# Patient Record
Sex: Female | Born: 1945 | Race: White | Hispanic: No | Marital: Married
Health system: Southern US, Community
[De-identification: ages and names within clinical notes are randomized; demographics above are authoritative.]

## PROBLEM LIST (undated history)

## (undated) DIAGNOSIS — I10 Essential (primary) hypertension: Secondary | ICD-10-CM

## (undated) DIAGNOSIS — J449 Chronic obstructive pulmonary disease, unspecified: Secondary | ICD-10-CM

## (undated) DIAGNOSIS — C3490 Malignant neoplasm of unspecified part of unspecified bronchus or lung: Secondary | ICD-10-CM

---

## 2018-04-28 ENCOUNTER — Encounter (HOSPITAL_COMMUNITY): Payer: Self-pay | Admitting: Adult Health

## 2018-04-28 ENCOUNTER — Inpatient Hospital Stay (HOSPITAL_COMMUNITY)
Admission: AD | Admit: 2018-04-28 | Discharge: 2018-06-02 | DRG: 853 | Disposition: E | Payer: Medicare Other | Source: Other Acute Inpatient Hospital | Attending: Pulmonary Disease | Admitting: Pulmonary Disease

## 2018-04-28 ENCOUNTER — Inpatient Hospital Stay (HOSPITAL_COMMUNITY): Payer: Medicare Other

## 2018-04-28 DIAGNOSIS — Z781 Physical restraint status: Secondary | ICD-10-CM

## 2018-04-28 DIAGNOSIS — D649 Anemia, unspecified: Secondary | ICD-10-CM | POA: Diagnosis not present

## 2018-04-28 DIAGNOSIS — I5021 Acute systolic (congestive) heart failure: Secondary | ICD-10-CM | POA: Diagnosis not present

## 2018-04-28 DIAGNOSIS — I313 Pericardial effusion (noninflammatory): Secondary | ICD-10-CM | POA: Diagnosis not present

## 2018-04-28 DIAGNOSIS — E876 Hypokalemia: Secondary | ICD-10-CM | POA: Diagnosis present

## 2018-04-28 DIAGNOSIS — R0603 Acute respiratory distress: Secondary | ICD-10-CM

## 2018-04-28 DIAGNOSIS — I252 Old myocardial infarction: Secondary | ICD-10-CM

## 2018-04-28 DIAGNOSIS — G9340 Encephalopathy, unspecified: Secondary | ICD-10-CM | POA: Diagnosis not present

## 2018-04-28 DIAGNOSIS — G9341 Metabolic encephalopathy: Secondary | ICD-10-CM | POA: Diagnosis present

## 2018-04-28 DIAGNOSIS — I110 Hypertensive heart disease with heart failure: Secondary | ICD-10-CM | POA: Diagnosis present

## 2018-04-28 DIAGNOSIS — Z9981 Dependence on supplemental oxygen: Secondary | ICD-10-CM

## 2018-04-28 DIAGNOSIS — I21A1 Myocardial infarction type 2: Secondary | ICD-10-CM | POA: Diagnosis present

## 2018-04-28 DIAGNOSIS — J189 Pneumonia, unspecified organism: Secondary | ICD-10-CM

## 2018-04-28 DIAGNOSIS — I2510 Atherosclerotic heart disease of native coronary artery without angina pectoris: Secondary | ICD-10-CM | POA: Diagnosis present

## 2018-04-28 DIAGNOSIS — R918 Other nonspecific abnormal finding of lung field: Secondary | ICD-10-CM | POA: Diagnosis not present

## 2018-04-28 DIAGNOSIS — J9621 Acute and chronic respiratory failure with hypoxia: Secondary | ICD-10-CM | POA: Diagnosis not present

## 2018-04-28 DIAGNOSIS — I248 Other forms of acute ischemic heart disease: Secondary | ICD-10-CM | POA: Diagnosis not present

## 2018-04-28 DIAGNOSIS — D6489 Other specified anemias: Secondary | ICD-10-CM | POA: Diagnosis present

## 2018-04-28 DIAGNOSIS — J151 Pneumonia due to Pseudomonas: Secondary | ICD-10-CM | POA: Diagnosis present

## 2018-04-28 DIAGNOSIS — G4733 Obstructive sleep apnea (adult) (pediatric): Secondary | ICD-10-CM | POA: Diagnosis present

## 2018-04-28 DIAGNOSIS — Z7189 Other specified counseling: Secondary | ICD-10-CM | POA: Diagnosis not present

## 2018-04-28 DIAGNOSIS — J441 Chronic obstructive pulmonary disease with (acute) exacerbation: Secondary | ICD-10-CM | POA: Diagnosis present

## 2018-04-28 DIAGNOSIS — Z9221 Personal history of antineoplastic chemotherapy: Secondary | ICD-10-CM | POA: Diagnosis not present

## 2018-04-28 DIAGNOSIS — I4891 Unspecified atrial fibrillation: Secondary | ICD-10-CM | POA: Diagnosis present

## 2018-04-28 DIAGNOSIS — T4275XA Adverse effect of unspecified antiepileptic and sedative-hypnotic drugs, initial encounter: Secondary | ICD-10-CM | POA: Diagnosis not present

## 2018-04-28 DIAGNOSIS — R739 Hyperglycemia, unspecified: Secondary | ICD-10-CM | POA: Diagnosis not present

## 2018-04-28 DIAGNOSIS — R0602 Shortness of breath: Secondary | ICD-10-CM | POA: Diagnosis not present

## 2018-04-28 DIAGNOSIS — Z66 Do not resuscitate: Secondary | ICD-10-CM | POA: Diagnosis present

## 2018-04-28 DIAGNOSIS — Z01818 Encounter for other preprocedural examination: Secondary | ICD-10-CM

## 2018-04-28 DIAGNOSIS — F329 Major depressive disorder, single episode, unspecified: Secondary | ICD-10-CM | POA: Diagnosis present

## 2018-04-28 DIAGNOSIS — J9600 Acute respiratory failure, unspecified whether with hypoxia or hypercapnia: Secondary | ICD-10-CM

## 2018-04-28 DIAGNOSIS — C3411 Malignant neoplasm of upper lobe, right bronchus or lung: Secondary | ICD-10-CM | POA: Diagnosis present

## 2018-04-28 DIAGNOSIS — Z8572 Personal history of non-Hodgkin lymphomas: Secondary | ICD-10-CM

## 2018-04-28 DIAGNOSIS — Z923 Personal history of irradiation: Secondary | ICD-10-CM

## 2018-04-28 DIAGNOSIS — R6521 Severe sepsis with septic shock: Secondary | ICD-10-CM | POA: Diagnosis present

## 2018-04-28 DIAGNOSIS — Z9911 Dependence on respirator [ventilator] status: Secondary | ICD-10-CM | POA: Diagnosis not present

## 2018-04-28 DIAGNOSIS — I952 Hypotension due to drugs: Secondary | ICD-10-CM | POA: Diagnosis present

## 2018-04-28 DIAGNOSIS — I214 Non-ST elevation (NSTEMI) myocardial infarction: Secondary | ICD-10-CM | POA: Diagnosis not present

## 2018-04-28 DIAGNOSIS — I10 Essential (primary) hypertension: Secondary | ICD-10-CM | POA: Diagnosis present

## 2018-04-28 DIAGNOSIS — J309 Allergic rhinitis, unspecified: Secondary | ICD-10-CM | POA: Diagnosis not present

## 2018-04-28 DIAGNOSIS — F17200 Nicotine dependence, unspecified, uncomplicated: Secondary | ICD-10-CM | POA: Diagnosis present

## 2018-04-28 DIAGNOSIS — J449 Chronic obstructive pulmonary disease, unspecified: Secondary | ICD-10-CM | POA: Diagnosis not present

## 2018-04-28 DIAGNOSIS — J9690 Respiratory failure, unspecified, unspecified whether with hypoxia or hypercapnia: Secondary | ICD-10-CM

## 2018-04-28 DIAGNOSIS — Z4659 Encounter for fitting and adjustment of other gastrointestinal appliance and device: Secondary | ICD-10-CM

## 2018-04-28 DIAGNOSIS — Z882 Allergy status to sulfonamides status: Secondary | ICD-10-CM

## 2018-04-28 DIAGNOSIS — J9622 Acute and chronic respiratory failure with hypercapnia: Secondary | ICD-10-CM | POA: Diagnosis not present

## 2018-04-28 DIAGNOSIS — Z85118 Personal history of other malignant neoplasm of bronchus and lung: Secondary | ICD-10-CM

## 2018-04-28 DIAGNOSIS — R451 Restlessness and agitation: Secondary | ICD-10-CM | POA: Diagnosis not present

## 2018-04-28 DIAGNOSIS — J810 Acute pulmonary edema: Secondary | ICD-10-CM | POA: Diagnosis not present

## 2018-04-28 DIAGNOSIS — Z515 Encounter for palliative care: Secondary | ICD-10-CM | POA: Diagnosis present

## 2018-04-28 DIAGNOSIS — Y95 Nosocomial condition: Secondary | ICD-10-CM | POA: Diagnosis present

## 2018-04-28 DIAGNOSIS — I5043 Acute on chronic combined systolic (congestive) and diastolic (congestive) heart failure: Secondary | ICD-10-CM | POA: Diagnosis present

## 2018-04-28 DIAGNOSIS — E872 Acidosis: Secondary | ICD-10-CM

## 2018-04-28 DIAGNOSIS — E1165 Type 2 diabetes mellitus with hyperglycemia: Secondary | ICD-10-CM | POA: Diagnosis present

## 2018-04-28 DIAGNOSIS — Z6828 Body mass index (BMI) 28.0-28.9, adult: Secondary | ICD-10-CM

## 2018-04-28 DIAGNOSIS — J9601 Acute respiratory failure with hypoxia: Secondary | ICD-10-CM | POA: Diagnosis present

## 2018-04-28 DIAGNOSIS — I340 Nonrheumatic mitral (valve) insufficiency: Secondary | ICD-10-CM | POA: Diagnosis not present

## 2018-04-28 DIAGNOSIS — E874 Mixed disorder of acid-base balance: Secondary | ICD-10-CM | POA: Diagnosis present

## 2018-04-28 DIAGNOSIS — T380X5A Adverse effect of glucocorticoids and synthetic analogues, initial encounter: Secondary | ICD-10-CM | POA: Diagnosis present

## 2018-04-28 DIAGNOSIS — E669 Obesity, unspecified: Secondary | ICD-10-CM | POA: Diagnosis present

## 2018-04-28 DIAGNOSIS — Z881 Allergy status to other antibiotic agents status: Secondary | ICD-10-CM | POA: Diagnosis not present

## 2018-04-28 DIAGNOSIS — J440 Chronic obstructive pulmonary disease with acute lower respiratory infection: Secondary | ICD-10-CM | POA: Diagnosis present

## 2018-04-28 DIAGNOSIS — A419 Sepsis, unspecified organism: Principal | ICD-10-CM | POA: Diagnosis present

## 2018-04-28 DIAGNOSIS — E785 Hyperlipidemia, unspecified: Secondary | ICD-10-CM | POA: Diagnosis present

## 2018-04-28 DIAGNOSIS — R1310 Dysphagia, unspecified: Secondary | ICD-10-CM | POA: Diagnosis not present

## 2018-04-28 DIAGNOSIS — F419 Anxiety disorder, unspecified: Secondary | ICD-10-CM | POA: Diagnosis present

## 2018-04-28 DIAGNOSIS — C3490 Malignant neoplasm of unspecified part of unspecified bronchus or lung: Secondary | ICD-10-CM | POA: Diagnosis not present

## 2018-04-28 DIAGNOSIS — Z91013 Allergy to seafood: Secondary | ICD-10-CM | POA: Diagnosis not present

## 2018-04-28 DIAGNOSIS — R34 Anuria and oliguria: Secondary | ICD-10-CM | POA: Diagnosis not present

## 2018-04-28 DIAGNOSIS — R061 Stridor: Secondary | ICD-10-CM | POA: Diagnosis not present

## 2018-04-28 DIAGNOSIS — Z0189 Encounter for other specified special examinations: Secondary | ICD-10-CM

## 2018-04-28 HISTORY — DX: Chronic obstructive pulmonary disease, unspecified: J44.9

## 2018-04-28 HISTORY — DX: Malignant neoplasm of unspecified part of unspecified bronchus or lung: C34.90

## 2018-04-28 HISTORY — DX: Essential (primary) hypertension: I10

## 2018-04-28 LAB — CBC WITH DIFFERENTIAL/PLATELET
Abs Immature Granulocytes: 0.1 10*3/uL (ref 0.0–0.1)
Basophils Absolute: 0.1 10*3/uL (ref 0.0–0.1)
Basophils Relative: 1 %
Eosinophils Absolute: 0.1 10*3/uL (ref 0.0–0.7)
Eosinophils Relative: 1 %
HCT: 39.2 % (ref 36.0–46.0)
Hemoglobin: 11.8 g/dL — ABNORMAL LOW (ref 12.0–15.0)
Immature Granulocytes: 0 %
Lymphocytes Relative: 25 %
Lymphs Abs: 3 10*3/uL (ref 0.7–4.0)
MCH: 28.2 pg (ref 26.0–34.0)
MCHC: 30.1 g/dL (ref 30.0–36.0)
MCV: 93.6 fL (ref 78.0–100.0)
Monocytes Absolute: 0.8 10*3/uL (ref 0.1–1.0)
Monocytes Relative: 7 %
Neutro Abs: 7.9 10*3/uL — ABNORMAL HIGH (ref 1.7–7.7)
Neutrophils Relative %: 66 %
Platelets: 330 10*3/uL (ref 150–400)
RBC: 4.19 MIL/uL (ref 3.87–5.11)
RDW: 13.6 % (ref 11.5–15.5)
WBC: 11.9 10*3/uL — ABNORMAL HIGH (ref 4.0–10.5)

## 2018-04-28 LAB — POCT I-STAT 3, ART BLOOD GAS (G3+)
ACID-BASE DEFICIT: 8 mmol/L — AB (ref 0.0–2.0)
Bicarbonate: 20.4 mmol/L (ref 20.0–28.0)
O2 Saturation: 100 %
TCO2: 22 mmol/L (ref 22–32)
pCO2 arterial: 52.4 mmHg — ABNORMAL HIGH (ref 32.0–48.0)
pH, Arterial: 7.199 — CL (ref 7.350–7.450)
pO2, Arterial: 237 mmHg — ABNORMAL HIGH (ref 83.0–108.0)

## 2018-04-28 LAB — COMPREHENSIVE METABOLIC PANEL
ALT: 84 U/L — ABNORMAL HIGH (ref 0–44)
AST: 88 U/L — AB (ref 15–41)
Albumin: 3.1 g/dL — ABNORMAL LOW (ref 3.5–5.0)
Alkaline Phosphatase: 54 U/L (ref 38–126)
Anion gap: 9 (ref 5–15)
BILIRUBIN TOTAL: 0.3 mg/dL (ref 0.3–1.2)
BUN: 11 mg/dL (ref 8–23)
CHLORIDE: 113 mmol/L — AB (ref 98–111)
CO2: 22 mmol/L (ref 22–32)
Calcium: 7.5 mg/dL — ABNORMAL LOW (ref 8.9–10.3)
Creatinine, Ser: 0.9 mg/dL (ref 0.44–1.00)
GFR calc Af Amer: >60 mL/min (ref >60)
GFR calc non Af Amer: >60 mL/min (ref >60)
GLUCOSE: 124 mg/dL — AB (ref 70–99)
POTASSIUM: 4.1 mmol/L (ref 3.5–5.1)
Sodium: 144 mmol/L (ref 135–145)
Total Protein: 5.6 g/dL — ABNORMAL LOW (ref 6.5–8.1)

## 2018-04-28 LAB — GLUCOSE, CAPILLARY
GLUCOSE-CAPILLARY: 125 mg/dL — AB (ref 70–99)
GLUCOSE-CAPILLARY: 179 mg/dL — AB (ref 70–99)

## 2018-04-28 LAB — MAGNESIUM: MAGNESIUM: 1.8 mg/dL (ref 1.7–2.4)

## 2018-04-28 LAB — PROTIME-INR
INR: 1.15
Prothrombin Time: 14.6 s (ref 11.4–15.2)

## 2018-04-28 LAB — TYPE AND SCREEN
ABO/RH(D): O POS
Antibody Screen: NEGATIVE

## 2018-04-28 LAB — ABO/RH: ABO/RH(D): O POS

## 2018-04-28 LAB — PHOSPHORUS: PHOSPHORUS: 3.6 mg/dL (ref 2.5–4.6)

## 2018-04-28 LAB — PROCALCITONIN: Procalcitonin: 1.72 ng/mL

## 2018-04-28 LAB — TRIGLYCERIDES: Triglycerides: 180 mg/dL — ABNORMAL HIGH

## 2018-04-28 LAB — LACTIC ACID, PLASMA
Lactic Acid, Venous: 1.3 mmol/L (ref 0.5–1.9)
Lactic Acid, Venous: 1.8 mmol/L (ref 0.5–1.9)

## 2018-04-28 LAB — MRSA PCR SCREENING: MRSA by PCR: NEGATIVE

## 2018-04-28 LAB — TROPONIN I
Troponin I: 0.99 ng/mL (ref <0.03)
Troponin I: 0.64 ng/mL

## 2018-04-28 MED ORDER — FENTANYL 2500MCG IN NS 250ML (10MCG/ML) PREMIX INFUSION
25.0000 ug/h | INTRAVENOUS | Status: DC
  Administered 2018-04-28: 150 ug/h via INTRAVENOUS
  Administered 2018-04-29: 175 ug/h via INTRAVENOUS
  Administered 2018-04-29 – 2018-04-30 (×2): 200 ug/h via INTRAVENOUS
  Administered 2018-05-01: 150 ug/h via INTRAVENOUS
  Administered 2018-05-02 (×2): 75 ug/h via INTRAVENOUS
  Filled 2018-04-28 (×5): qty 250

## 2018-04-28 MED ORDER — FENTANYL BOLUS VIA INFUSION
25.0000 ug | INTRAVENOUS | Status: DC | PRN
  Administered 2018-04-29 – 2018-05-01 (×3): 25 ug via INTRAVENOUS
  Filled 2018-04-28: qty 25

## 2018-04-28 MED ORDER — NOREPINEPHRINE 16 MG/250ML-% IV SOLN
0.0000 ug/min | INTRAVENOUS | Status: DC
  Administered 2018-04-28: 40 ug/min via INTRAVENOUS
  Filled 2018-04-28 (×3): qty 250

## 2018-04-28 MED ORDER — HEPARIN (PORCINE) IN NACL 100-0.45 UNIT/ML-% IJ SOLN
1000.0000 [IU]/h | INTRAMUSCULAR | Status: DC
  Administered 2018-04-28 (×2): 1000 [IU]/h via INTRAVENOUS
  Filled 2018-04-28: qty 250

## 2018-04-28 MED ORDER — MIDAZOLAM HCL 2 MG/2ML IJ SOLN
1.0000 mg | INTRAMUSCULAR | Status: DC | PRN
  Administered 2018-04-29: 1 mg via INTRAVENOUS
  Filled 2018-04-28: qty 2

## 2018-04-28 MED ORDER — ORAL CARE MOUTH RINSE
15.0000 mL | OROMUCOSAL | Status: DC
  Administered 2018-04-28 – 2018-05-06 (×71): 15 mL via OROMUCOSAL

## 2018-04-28 MED ORDER — CHLORHEXIDINE GLUCONATE 0.12% ORAL RINSE (MEDLINE KIT)
15.0000 mL | Freq: Two times a day (BID) | OROMUCOSAL | Status: DC
  Administered 2018-04-28 – 2018-05-06 (×16): 15 mL via OROMUCOSAL

## 2018-04-28 MED ORDER — SODIUM CHLORIDE 0.9 % IV SOLN
250.0000 mL | INTRAVENOUS | Status: DC | PRN
  Administered 2018-04-30 – 2018-05-07 (×3): 250 mL via INTRAVENOUS

## 2018-04-28 MED ORDER — PROPOFOL 500 MG/50ML IV EMUL
INTRAVENOUS | Status: AC
  Filled 2018-04-28: qty 50

## 2018-04-28 MED ORDER — SODIUM CHLORIDE 0.9 % IV SOLN
500.0000 mg | INTRAVENOUS | Status: DC
  Administered 2018-04-28 – 2018-04-29 (×2): 500 mg via INTRAVENOUS
  Filled 2018-04-28 (×2): qty 500

## 2018-04-28 MED ORDER — ALBUTEROL SULFATE (2.5 MG/3ML) 0.083% IN NEBU
2.5000 mg | INHALATION_SOLUTION | RESPIRATORY_TRACT | Status: DC | PRN
  Administered 2018-05-04 – 2018-05-13 (×5): 2.5 mg via RESPIRATORY_TRACT
  Filled 2018-04-28 (×5): qty 3

## 2018-04-28 MED ORDER — FAMOTIDINE IN NACL 20-0.9 MG/50ML-% IV SOLN
20.0000 mg | Freq: Two times a day (BID) | INTRAVENOUS | Status: DC
  Administered 2018-04-28 – 2018-05-01 (×7): 20 mg via INTRAVENOUS
  Filled 2018-04-28 (×7): qty 50

## 2018-04-28 MED ORDER — PROPOFOL 1000 MG/100ML IV EMUL
0.0000 ug/kg/min | INTRAVENOUS | Status: DC
  Administered 2018-04-28: 50 ug/kg/min via INTRAVENOUS

## 2018-04-28 MED ORDER — NOREPINEPHRINE 4 MG/250ML-% IV SOLN
0.0000 ug/min | INTRAVENOUS | Status: DC
  Administered 2018-04-28: 10 ug/min via INTRAVENOUS
  Filled 2018-04-28 (×2): qty 250

## 2018-04-28 MED ORDER — FENTANYL CITRATE (PF) 100 MCG/2ML IJ SOLN: 50.0000 ug | INTRAMUSCULAR | Status: DC | PRN

## 2018-04-28 MED ORDER — HEPARIN (PORCINE) IN NACL 100-0.45 UNIT/ML-% IJ SOLN
INTRAMUSCULAR | Status: AC
  Filled 2018-04-28: qty 250

## 2018-04-28 MED ORDER — SODIUM CHLORIDE 0.9 % IV SOLN
2.0000 g | INTRAVENOUS | Status: DC
  Administered 2018-04-28 – 2018-04-29 (×2): 2 g via INTRAVENOUS
  Filled 2018-04-28 (×2): qty 20

## 2018-04-28 MED ORDER — MIDAZOLAM HCL 2 MG/2ML IJ SOLN
1.0000 mg | INTRAMUSCULAR | Status: DC | PRN
  Administered 2018-04-29 – 2018-05-01 (×3): 1 mg via INTRAVENOUS
  Filled 2018-04-28 (×4): qty 2

## 2018-04-28 MED ORDER — PROPOFOL 1000 MG/100ML IV EMUL
INTRAVENOUS | Status: AC
  Filled 2018-04-28: qty 100

## 2018-04-28 MED ORDER — METHYLPREDNISOLONE SODIUM SUCC 125 MG IJ SOLR
80.0000 mg | Freq: Two times a day (BID) | INTRAMUSCULAR | Status: DC
Start: 2018-04-28 — End: 2018-04-29
  Administered 2018-04-28 – 2018-04-29 (×3): 80 mg via INTRAVENOUS
  Filled 2018-04-28 (×3): qty 2

## 2018-04-28 MED ORDER — FENTANYL CITRATE (PF) 100 MCG/2ML IJ SOLN: 50.0000 ug | Freq: Once | INTRAMUSCULAR | Status: DC

## 2018-04-28 MED ORDER — HEPARIN SODIUM (PORCINE) 5000 UNIT/ML IJ SOLN: 5000.0000 [IU] | Freq: Three times a day (TID) | INTRAMUSCULAR | Status: DC

## 2018-04-28 MED ORDER — IPRATROPIUM-ALBUTEROL 0.5-2.5 (3) MG/3ML IN SOLN
3.0000 mL | Freq: Four times a day (QID) | RESPIRATORY_TRACT | Status: DC
  Administered 2018-04-28 – 2018-05-07 (×34): 3 mL via RESPIRATORY_TRACT
  Filled 2018-04-28 (×34): qty 3

## 2018-04-28 NOTE — H&P (Addendum)
PULMONARY / CRITICAL CARE MEDICINE   Name: Dawn Howe MRN: [id number] DOB: 1945/11/22    ADMISSION DATE:  04/30/2018  REFERRING MD:  Oval Linsey   CHIEF COMPLAINT:  Respiratory failure, MI   HISTORY OF PRESENT ILLNESS:   72 year old female active smoker who lives in [place] and is traveling to [place] with a friend, with known history of lung cancer status post radiation and chemo, CHF, CAD, hypertension, COPD on 2 L nasal cannula at baseline who initially presented 6/27 to [facility] with progressive shortness of breath which reportedly progressed to the point of near syncope at which time she called EMS.  On arrival to [facility] she was severely hypoxic, getting somnolent with severe respiratory acidosis and acute on chronic mixed respiratory failure.  She was intubated in the ER at [facility].  CTA chest was negative for PE but did show hilar lung mass with bilateral pulmonary artery branch involvement and?  Lymphangitic spread of tumor.  Work-up also revealed elevated troponin and nonspecific EKG changes, and echocardiogram showed severe global hypokinesis and EF <20%.  She was transferred to [facility] for probable NSTEMI and cardiology evaluation.  PAST MEDICAL HISTORY :  COPD CAD Hypertension Lung cancer   PAST SURGICAL HISTORY: She  has no past surgical history on file.  Allergies  Allergen Reactions  . Doxycycline   . Shellfish Allergy   . Sulfa Antibiotics     No current facility-administered medications on file prior to encounter.    No current outpatient medications on file prior to encounter.    FAMILY HISTORY:  Her has no family status information on file.    SOCIAL HISTORY: Active smoker.  Lives in [place], traveling to [place] with a friend.  REVIEW OF SYSTEMS:   Unable.  Patient sedated on the vent.  SUBJECTIVE:    VITAL SIGNS: There were no vitals taken for this visit.  HEMODYNAMICS:    VENTILATOR  SETTINGS:    INTAKE / OUTPUT: No intake/output data recorded.  PHYSICAL EXAMINATION: General: Chronically ill-appearing female, no acute distress on vent Neuro: Sedated on vent, RASS -2 HEENT: MM moist, ETT Cardiovascular: S1-S2 RRR Lungs: Respirations even nonlabored on vent, significantly diminished throughout Abdomen: Obese, round, soft Musculoskeletal: Warm and dry, scant BLE edema  LABS:  BMET No results for input(s): NA, K, CL, CO2, BUN, CREATININE, GLUCOSE in the last 168 hours.  Electrolytes No results for input(s): CALCIUM, MG, PHOS in the last 168 hours.  CBC No results for input(s): WBC, HGB, HCT, PLT in the last 168 hours.  Coag's No results for input(s): APTT, INR in the last 168 hours.  Sepsis Markers No results for input(s): LATICACIDVEN, PROCALCITON, O2SATVEN in the last 168 hours.  ABG No results for input(s): PHART, PCO2ART, PO2ART in the last 168 hours.  Liver Enzymes No results for input(s): AST, ALT, ALKPHOS, BILITOT, ALBUMIN in the last 168 hours.  Cardiac Enzymes No results for input(s): TROPONINI, PROBNP in the last 168 hours.  Glucose No results for input(s): GLUCAP in the last 168 hours.  Imaging No results found.   STUDIES:  2D echo ([place]) 6/27>>> severe global hypokinesis, EF <20% CTA chest ([place]) 6/27>>>neg PE, CAD, bilateral pulmonary artery branch involvement by 3.8cm R hilar mass.  Right hilar, mediastinal, and R supraclavicular adenopathy. RUL parenchymal changes suggesting lymphangitic spread of tumor.  Bilateral small pulmonary nodules.    CULTURES: Blood cultures X2  6/27 >> Urine 6/27 >>  ANTIBIOTICS: Rocephin 6/27>>> azithro 6/27>>>  SIGNIFICANT EVENTS:  6/27 tx [facility]   LINES/TUBES: ETT 6/27 >> Left IJ CVL Oval Linsey) 6/27 >>  DISCUSSION: 72 year old female with history of COPD, lung cancer who presented 6/27 with acute on chronic respiratory failure and ? and STEMI.  Transferred to [facility] from  [facility] for cardiology evaluation and possible cardiac cath.  ASSESSMENT / PLAN:  PULMONARY Acute on chronic respiratory failure Respiratory acidosis AE COPD Lung cancer- no imaging to compare to but she reportedly had PET scan done a few months ago which was "stable".  CTA negative for PE but does show involvement of pulmonary artery branches and?  Lymphangitic spread Tobacco abuse ?  Postobstructive pneumonia P:   Vent support - 8cc/kg  F/u CXR now and in a.m. F/u ABG now and in a.m. duonebs  Empiric antibiotics IV steroids Sputum culture Smoking cessation  CARDIOVASCULAR Elevated troponin History of CAD ?  NSTEMI Hypotension/shock P:  Cardiology to see Trend troponin Check BNP Levo fed-titrate as needed to keep map >65 Echo as above Check lactate, procalcitonin  trend CVP Continue heparin drip for now Aspirin  RENAL Lactic acidosis P:   Gentle volume Follow-up chemistry Trend lactate  GASTROINTESTINAL No active issue P:   N.p.o. Start tube feed in a.m. if no plans for cath Pepcid  HEMATOLOGIC No active issue P:  F/u CBC Heparin drip initiated at [facility] for ? NSTEMI   INFECTIOUS Leukocytosis P:   Empiric abx as above  Panculture Trend procalcitonin  ENDOCRINE No active issue P:   TSH Monitor glucose on chemistry  NEUROLOGIC Altered mental status-related to hypercarbic respiratory failure P:   RASS goal: -1 PRN fentanyl DC propofol drip given significant hypotension   FAMILY  - Updates: No family at bedside on NP rounds 6/27  - Inter-disciplinary family meet or Palliative Care meeting due by: Day 7   Nickolas Madrid, NP 04/05/2018  4:34 PM Pager: [phone] or [phone]  Attending Note:  72 year old female with COPD, lung cancer that is an active smoker who presents with hypercarbic respiratory failure and concern for pneumonia.  Patient was intubated and echo was consistent with diffuse hypokinesis.   Cardiology evaluated patient and wanted to cath her so patient was transferred to [facility] for cardiology to evaluate.  I reviewed CT of the chest, masses and LAN noted with infiltrate.  Will adjust vent to PCV for ABG.  F/u ABG now.  Place an a-line to monitor BP.  Levophed for BP support.  Start rocephin and zithromax and pan cultures.  Cardiology contacted.  PCCM will continue to follow.  The patient is critically ill with multiple organ systems failure and requires high complexity decision making for assessment and support, frequent evaluation and titration of therapies, application of advanced monitoring technologies and extensive interpretation of multiple databases.   Critical Care Time devoted to patient care services described in this note is  35  Minutes. This time reflects time of care of this signee Dr Jennet Maduro. This critical care time does not reflect procedure time, or teaching time or supervisory time of PA/NP/Med student/Med Resident etc but could involve care discussion time.  Rush Farmer, M.D. [facility] Pulmonary/Critical Care Medicine. Pager: [phone]. After hours pager: [phone].

## 2018-04-28 NOTE — Progress Notes (Signed)
ANTICOAGULATION CONSULT NOTE - Initial Consult  Pharmacy Consult for heparin Indication: chest pain/ACS  Allergies  Allergen Reactions  . Doxycycline   . Shellfish Allergy   . Sulfa Antibiotics     Patient Measurements:   Patient weight 85 kg  Vital Signs: Temp: 99.7 F (37.6 C) (06/27 1651) Pulse Rate: 101 (06/27 1651)  Labs: No results for input(s): HGB, HCT, PLT, APTT, LABPROT, INR, HEPARINUNFRC, HEPRLOWMOCWT, CREATININE, CKTOTAL, CKMB, TROPONINI in the last 72 hours.  CrCl cannot be calculated (No order found.).  Assessment: CC/HPI: 72 yo f presenting from [facility] with MI, resp failure - travelling to [place] with friend Initially presented hypoxic after syncope - cta neg for PE, EF < 20%  PMH: lung ca post rad/chemo, CHF, CAD, HTn, COPD on 2L o2  Anticoag: hep for acs r/o pending cards eval  1000 units/hr running from [place] - 85 kg  baseline labs pending   Pulm: vent  Goal of Therapy:  Heparin level 0.3-0.7 units/ml Monitor platelets by anticoagulation protocol: Yes   Plan:  Dc sq hep Resume 1000 units/hr heparin from [place] Initial HL 0000 Daily HL CBC F/u Cards plans for cath  Levester Fresh, PharmD, BCPS, BCCCP Clinical Pharmacist Clinical phone for 04/08/2018 from 1430 - 2300: [phone] If after 2300, please call main pharmacy at: [phone] 04/07/2018 4:54 PM

## 2018-04-28 NOTE — Consult Note (Addendum)
Cardiology Consultation:   Patient ID: Dawn Howe; [id number]; 29-Apr-1946   Admit date: 04/13/2018 Date of Consult: 04/18/2018  Primary Care Provider: Patient, No Pcp Per Primary Cardiologist: Cardiologist at [place] [facility]   Patient Profile:   Dawn Howe is a 72 y.o. female with a hx of COPD with chronic respiratory failure on home oxygen, lung cancer status post radiation and chemo, hypertension, hyperlipidemia who is being seen today for the evaluation of CHF at the request of Dr. Nelda Marseille.  History of Present Illness:   Dawn Howe is a 72 year old obese female who lives in [place] and is traveling with a female friend to this area.  She has a known history of lung cancer status post radiation and chemo, reported PET scan done 3 months ago and patient was told it looked stable, COPD with chronic respiratory failure on oxygen 2 L, hypertension and tobacco abuse.  She reportedly takes a fluid pill and possibly could have prior CHF.  Per review of the notes she had apparently been getting progressively more short of breath over the few days prior to presentation.  She continued to smoke and reportedly almost passed out and that is when she called EMS.  Upon arrival to the ER at [facility] she was unable to speak more than a word at a time and she was severely hypoxic and getting somnolent.  An ABG showed severe respiratory acidosis with acute on chronic hypoxic and hypercapnic respiratory failure.  Her BNP was stable, 554.  Her troponin was initially normal and rose to 0.66.  CTA of the chest did not show acute PE but did show a lung mass posterior bilateral pulmonary arteries along with possible lymphatic spread of her cancer.  An echocardiogram showed mildly dilated LV, severe global hypokinesis of the LV, EF<20% LA mildly dilated, mild TR. She was initially tachycardic with rates in the 150s.  Follow-up EKG showed sinus rhythm, 97 bpm with rightward axis and mild diffuse  T wave inversions.  She was intubated in the ED and subsequently transferred to [facility].  She is on a heparin drip, aspirin and high intensity statin through the OG tube.  Beta-blocker has not been started due to hypotension after intubation and sedation.  The patient's friend is in the room and is a Marine scientist.  They traveled appear together from [place].  Her friend reports that the patient was not having any chest pain.  She developed worsening shortness of breath, could not catch her breath and was diaphoretic and using accessory muscles.  She reports that the patient had been cutting down her smoking but was recently smoking even more, half pack per day due to stress.  They are here to clean out her sister's house who died 4 years ago.  She also reports that the patient is supposed to be on constant oxygen but she only wears it as she feels like she needs it.  I am unable to get a family history at this time the patient's friend thinks that the patient's mom had diabetes and hypertension and that the dad had hypertension and possibly died of a heart attack.  The patient has a pulmonologist and cardiologist to her husband and wife in [place], the pulmonologist name is Dr. Debbra Riding.  That doctors has been is the cardiologist but we do not know his name.  I have a phone number and will attempt to obtain cardiology records.  The patient is married and her husband is currently in [place].  His phone number [phone].  troponins 0.02, 0.66 at [facility] Lactic acid 6.2> 1.7 at [facility] negative  Past Medical History:  Diagnosis Date  . COPD (chronic obstructive pulmonary disease) ([place])   . Hypertension   . Lung cancer [facility])     History is incomplete due to patient being intubated and no family present to provide information.  Home Medications:  Prior to Admission medications   Not on File    Inpatient Medications: Scheduled Meds: . fentaNYL (SUBLIMAZE) injection  50 mcg  Intravenous Once  . ipratropium-albuterol  3 mL Nebulization Q6H  . methylPREDNISolone (SOLU-MEDROL) injection  80 mg Intravenous Q12H   Continuous Infusions: . sodium chloride    . azithromycin    . cefTRIAXone (ROCEPHIN)  IV    . famotidine (PEPCID) IV    . fentaNYL infusion INTRAVENOUS    . heparin    . heparin    . norepinephrine (LEVOPHED) Adult infusion 30 mcg/min (04/02/2018 1637)   PRN Meds: sodium chloride, albuterol, fentaNYL, midazolam, midazolam  Allergies:    Allergies  Allergen Reactions  . Doxycycline   . Shellfish Allergy   . Sulfa Antibiotics     Social History:   Social History   Socioeconomic History  . Marital status: Married    Spouse name: Not on file  . Number of children: Not on file  . Years of education: Not on file  . Highest education level: Not on file  Occupational History  . Not on file  Social Needs  . Financial resource strain: Not on file  . Food insecurity:    Worry: Not on file    Inability: Not on file  . Transportation needs:    Medical: Not on file    Non-medical: Not on file  Tobacco Use  . Smoking status: Not on file  Substance and Sexual Activity  . Alcohol use: Not on file  . Drug use: Not on file  . Sexual activity: Not on file  Lifestyle  . Physical activity:    Days per week: Not on file    Minutes per session: Not on file  . Stress: Not on file  Relationships  . Social connections:    Talks on phone: Not on file    Gets together: Not on file    Attends religious service: Not on file    Active member of club or organization: Not on file    Attends meetings of clubs or organizations: Not on file    Relationship status: Not on file  . Intimate partner violence:    Fear of current or ex partner: Not on file    Emotionally abused: Not on file    Physically abused: Not on file    Forced sexual activity: Not on file  Other Topics Concern  . Not on file  Social History Narrative  . Not on file    Family  History:   No family history on file.  Patient intubated and no family present to provide information  ROS:  Please see the history of present illness.   Unable to obtain full review of systems due to patient being intubated and sedated.  Physical Exam/Data:   Vitals:   04/22/2018 1630 04/06/2018 1651  Pulse:  (!) 101  Resp:  19  Temp:  99.7 F (37.6 C)  Weight: 187 lb 13.3 oz (85.2 kg)    No intake or output data in the 24 hours ending 04/25/2018 1702 Filed Weights   04/11/2018  1630  Weight: 187 lb 13.3 oz (85.2 kg)   There is no height or weight on file to calculate BMI.  General: Obese female, intubated, currently on IV pressors treating low blood pressure HEENT: normal Lymph: no adenopathy Neck: no JVD Endocrine:  No thryomegaly Vascular: No carotid bruits; pedal pulses 2+ Cardiac:  normal S1, S2; RRR; no murmur  Lungs: Faint expiratory wheezes, ventilator noise Abd: soft, nontender, no hepatomegaly  Ext: Trace generalized edema Musculoskeletal:  No obvious deformities Skin: warm and dry, pale Neuro: Patient is sedated and intubated.  Opens eyes to speech but no communication. Psych: Withdrawn  EKG:  The EKG was personally reviewed and demonstrates:  sinus rhythm, 97 bpm with rightward axis and mild diffuse T wave inversions  Telemetry:  Telemetry was personally reviewed and demonstrates: Sinus rhythm in the 90s  Relevant CV Studies:  Echocardiogram 04/24/2018 at [facility] - Mildly dilated LV -Severe global hypokinesis of the LV -Overall LV systolic function is severely impaired with an EF <20% - The right ventricular systolic function is at the low end of normal -The left atrium is mildly dilated - Mild tricuspid regurgitation -No evidence of pulmonary hypertension - The inferior vena cava is mildly dilated -There is a small localized pericardial effusion next to the RA and RV -There is no evidence of cardiac tamponade  Laboratory Data:  ChemistryNo  results for input(s): NA, K, CL, CO2, GLUCOSE, BUN, CREATININE, CALCIUM, GFRNONAA, GFRAA, ANIONGAP in the last 168 hours.  No results for input(s): PROT, ALBUMIN, AST, ALT, ALKPHOS, BILITOT in the last 168 hours. HematologyNo results for input(s): WBC, RBC, HGB, HCT, MCV, MCH, MCHC, RDW, PLT in the last 168 hours. Cardiac EnzymesNo results for input(s): TROPONINI in the last 168 hours. No results for input(s): TROPIPOC in the last 168 hours.  BNPNo results for input(s): BNP, PROBNP in the last 168 hours.  DDimer No results for input(s): DDIMER in the last 168 hours.  Radiology/Studies:  Dg Chest Port 1 View  Result Date: 04/10/2018 CLINICAL DATA:  Endotracheal tube. EXAM: PORTABLE CHEST 1 VIEW COMPARISON:  Chest CT from earlier today FINDINGS: Endotracheal tube tip between the clavicular heads and carina. Tip projects nearly 3 cm above the carina. Left IJ line with tip near the SVC origin. New orogastric tube reaches the stomach. Large lung volumes with emphysema. Perihilar masslike abnormality with adenopathy by recent chest CT. IMPRESSION: 1. New orogastric tube reaches the stomach. 2. Otherwise stable chest as characterized by CT earlier today. Electronically Signed   By: Monte Fantasia M.D.   On: 04/14/2018 16:55    Assessment and Plan:   NSTEMI -Patient presented with acute on chronic respiratory failure.  Transferred from [facility] where she was found to have a troponin of 0.66.  BNP 554.  -Possible troponin elevation due to demand ischemia in setting of acute respiratory failure and possible progression of her lung cancer -Possible NSTEMI contributing to her respiratory failure -Continue to cycle cardiac enzymes -The patient is not currently a candidate for cardiac catheter ischemic evaluation. -Dr Johnsie Cancel to see pt  Acute systolic heart failure -COPD with chronic respiratory failure on oxygen 2 L, continues to smoke - history of lung cancer status post radiation and chemo,  reported PET scan done 3 months ago and patient was told it looked stable.  At [facility] CTA of the chest did not show acute PE but did show a lung mass posterior bilateral pulmonary arteries along with possible lymphatic spread of her cancer.   -  Currently intubated, on IV steroids, IV azithromycin, nebulizer treatments  Acute systolic heart failure -BNP 544 -An echocardiogram showed mildly dilated LV, severe global hypokinesis of the LV, EF<20% LA mildly dilated, mild TR.   -Currently not on beta-blocker, ACEI/ARB or diuretics due to hypotension -Unknown baseline.  I will attempt to obtain cardiology records from [place] [facility] (fax [phone])  Hypertension -Patient is currently hypotensive requiring hemodynamic support  Tobacco abuse -Should be addressed once patient recovers from this illness  Hyperlipidemia    For questions or updates, please contact [place] Please consult [email or web address] for contact info under Cardiology/STEMI.   Signed, Daune Perch, NP  04/19/2018 5:02 PM   Patient examined chart reviewed. Discussed care with nurses PA, and patients friend of 25 years who is a Marine scientist and traveling with her Selinda Eon.  Primary issue was respiratory arrest CO2 100 with severe metabolic acidosis. She indicates not having active Rx for her lung cancer and "passing" a PET scan in May but CT done at [facility] suggests lymphangitic spread with mets to the pulmonary arteries. Troponin only .66 and no acute ST elevation on ECG just ST with non specific anterolateral T wave changes. Exam with intubated obese chronically ill female Left IJ OG tube and ETT. ? SEM vs mechanical sound from vent. Abdomen soft LE cool.  Not clear if EF has been low before Will try to call her cardiologist in am as she has had previous chemo. She was traveling with just compressed air and not oxygen. She clearly does not need acute right and left heart cath and doubt acute cardiac event and more  likely Takatsubo like picture with severe acidosis . 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

## 2018-04-28 NOTE — Progress Notes (Signed)
ABG results reported to CCM Dr Nelda Marseille. No changes to orders at this time.

## 2018-04-28 NOTE — Progress Notes (Signed)
CRITICAL VALUE ALERT  Critical Value:  Troponin .99  Date & Time Notied:  04/12/2018 1950   Provider Notified: Warren Lacy  Orders Received/Actions taken: none atm

## 2018-04-28 NOTE — Procedures (Signed)
Arterial Catheter Insertion Procedure Note Dawn Howe [id number] 1946/03/11  Procedure: Insertion of Arterial Catheter  Indications: Blood pressure monitoring and Frequent blood sampling  Procedure Details Consent: Unable to obtain consent because of emergent medical necessity. Time Out: Verified patient identification, verified procedure, site/side was marked, verified correct patient position, special equipment/implants available, medications/allergies/relevent history reviewed, required imaging and test results available.  Performed  Maximum sterile technique was used including antiseptics, cap, gloves, gown, hand hygiene, mask and sheet. Skin prep: Chlorhexidine; local anesthetic administered 20 gauge catheter was inserted into right radial artery using the Seldinger technique. ULTRASOUND GUIDANCE USED: YES Evaluation Blood flow good; BP tracing good. Complications: No apparent complications.   Jennet Maduro 04/05/2018

## 2018-04-29 ENCOUNTER — Other Ambulatory Visit (HOSPITAL_COMMUNITY): Payer: Medicare Other

## 2018-04-29 DIAGNOSIS — I5043 Acute on chronic combined systolic (congestive) and diastolic (congestive) heart failure: Secondary | ICD-10-CM | POA: Diagnosis present

## 2018-04-29 DIAGNOSIS — I214 Non-ST elevation (NSTEMI) myocardial infarction: Secondary | ICD-10-CM | POA: Diagnosis present

## 2018-04-29 LAB — HEMOGLOBIN A1C
Hgb A1c MFr Bld: 6.6 % — ABNORMAL HIGH (ref 4.8–5.6)
Mean Plasma Glucose: 142.72 mg/dL

## 2018-04-29 LAB — CBC
HCT: 41.9 % (ref 36.0–46.0)
Hemoglobin: 12.7 g/dL (ref 12.0–15.0)
MCH: 28.9 pg (ref 26.0–34.0)
MCHC: 30.3 g/dL (ref 30.0–36.0)
MCV: 95.2 fL (ref 78.0–100.0)
PLATELETS: 372 10*3/uL (ref 150–400)
RBC: 4.4 MIL/uL (ref 3.87–5.11)
RDW: 13.5 % (ref 11.5–15.5)
WBC: 21.6 10*3/uL — ABNORMAL HIGH (ref 4.0–10.5)

## 2018-04-29 LAB — MAGNESIUM
Magnesium: 1.7 mg/dL (ref 1.7–2.4)
Magnesium: 1.8 mg/dL (ref 1.7–2.4)

## 2018-04-29 LAB — GLUCOSE, CAPILLARY
GLUCOSE-CAPILLARY: 228 mg/dL — AB (ref 70–99)
GLUCOSE-CAPILLARY: 190 mg/dL — AB (ref 70–99)
GLUCOSE-CAPILLARY: 204 mg/dL — AB (ref 70–99)
Glucose-Capillary: 176 mg/dL — ABNORMAL HIGH (ref 70–99)
Glucose-Capillary: 183 mg/dL — ABNORMAL HIGH (ref 70–99)
Glucose-Capillary: 224 mg/dL — ABNORMAL HIGH (ref 70–99)
Glucose-Capillary: 275 mg/dL — ABNORMAL HIGH (ref 70–99)

## 2018-04-29 LAB — URINE CULTURE: Culture: NO GROWTH

## 2018-04-29 LAB — PHOSPHORUS
PHOSPHORUS: 1.9 mg/dL — AB (ref 2.5–4.6)
Phosphorus: 2.7 mg/dL (ref 2.5–4.6)

## 2018-04-29 LAB — TROPONIN I: Troponin I: 0.51 ng/mL (ref <0.03)

## 2018-04-29 LAB — HEPARIN LEVEL (UNFRACTIONATED): HEPARIN UNFRACTIONATED: 0.6 [IU]/mL (ref 0.30–0.70)

## 2018-04-29 MED ORDER — QUETIAPINE FUMARATE 50 MG PO TABS
50.0000 mg | ORAL_TABLET | Freq: Two times a day (BID) | ORAL | Status: DC
  Administered 2018-04-29 – 2018-04-30 (×4): 50 mg
  Filled 2018-04-29 (×4): qty 1

## 2018-04-29 MED ORDER — INSULIN ASPART 100 UNIT/ML ~~LOC~~ SOLN
0.0000 [IU] | Freq: Three times a day (TID) | SUBCUTANEOUS | Status: DC
  Administered 2018-04-29: 3 [IU] via SUBCUTANEOUS
  Administered 2018-04-29 (×2): 5 [IU] via SUBCUTANEOUS

## 2018-04-29 MED ORDER — VITAL HIGH PROTEIN PO LIQD
1000.0000 mL | ORAL | Status: DC
  Administered 2018-04-29: 1000 mL

## 2018-04-29 MED ORDER — DEXMEDETOMIDINE HCL IN NACL 400 MCG/100ML IV SOLN
0.4000 ug/kg/h | INTRAVENOUS | Status: DC
  Administered 2018-04-29 (×2): 0.4 ug/kg/h via INTRAVENOUS
  Administered 2018-04-30 (×2): 1 ug/kg/h via INTRAVENOUS
  Administered 2018-04-30: 0.4 ug/kg/h via INTRAVENOUS
  Administered 2018-05-01: 0.5 ug/kg/h via INTRAVENOUS
  Administered 2018-05-01 (×4): 1 ug/kg/h via INTRAVENOUS
  Administered 2018-05-02: 1.001 ug/kg/h via INTRAVENOUS
  Administered 2018-05-02 (×3): 1 ug/kg/h via INTRAVENOUS
  Administered 2018-05-03: 0.8 ug/kg/h via INTRAVENOUS
  Administered 2018-05-03: 1 ug/kg/h via INTRAVENOUS
  Administered 2018-05-03: 0.5 ug/kg/h via INTRAVENOUS
  Administered 2018-05-04: 1.1 ug/kg/h via INTRAVENOUS
  Administered 2018-05-04: 0.5 ug/kg/h via INTRAVENOUS
  Administered 2018-05-04: 1.1 ug/kg/h via INTRAVENOUS
  Administered 2018-05-04: 0.9 ug/kg/h via INTRAVENOUS
  Administered 2018-05-04: 1.1 ug/kg/h via INTRAVENOUS
  Administered 2018-05-05 (×2): 1.2 ug/kg/h via INTRAVENOUS
  Administered 2018-05-05 – 2018-05-06 (×2): 0.4 ug/kg/h via INTRAVENOUS
  Filled 2018-04-29 (×28): qty 100

## 2018-04-29 MED ORDER — INSULIN ASPART 100 UNIT/ML ~~LOC~~ SOLN
0.0000 [IU] | SUBCUTANEOUS | Status: DC
  Administered 2018-04-29: 5 [IU] via SUBCUTANEOUS
  Administered 2018-04-30 (×2): 3 [IU] via SUBCUTANEOUS
  Administered 2018-04-30: 2 [IU] via SUBCUTANEOUS
  Administered 2018-04-30: 5 [IU] via SUBCUTANEOUS
  Administered 2018-04-30 – 2018-05-01 (×3): 2 [IU] via SUBCUTANEOUS
  Administered 2018-05-01: 3 [IU] via SUBCUTANEOUS
  Administered 2018-05-01: 5 [IU] via SUBCUTANEOUS
  Administered 2018-05-01: 3 [IU] via SUBCUTANEOUS
  Administered 2018-05-01: 2 [IU] via SUBCUTANEOUS
  Administered 2018-05-01: 5 [IU] via SUBCUTANEOUS
  Administered 2018-05-01 – 2018-05-02 (×2): 3 [IU] via SUBCUTANEOUS
  Administered 2018-05-02: 5 [IU] via SUBCUTANEOUS
  Administered 2018-05-02: 3 [IU] via SUBCUTANEOUS
  Administered 2018-05-02: 5 [IU] via SUBCUTANEOUS
  Administered 2018-05-02: 3 [IU] via SUBCUTANEOUS
  Administered 2018-05-02: 8 [IU] via SUBCUTANEOUS
  Administered 2018-05-03: 5 [IU] via SUBCUTANEOUS
  Administered 2018-05-03: 8 [IU] via SUBCUTANEOUS
  Administered 2018-05-03: 2 [IU] via SUBCUTANEOUS
  Administered 2018-05-04: 5 [IU] via SUBCUTANEOUS
  Administered 2018-05-04: 3 [IU] via SUBCUTANEOUS
  Administered 2018-05-04: 5 [IU] via SUBCUTANEOUS
  Administered 2018-05-04: 2 [IU] via SUBCUTANEOUS
  Administered 2018-05-04: 3 [IU] via SUBCUTANEOUS
  Administered 2018-05-05: 8 [IU] via SUBCUTANEOUS
  Administered 2018-05-05: 5 [IU] via SUBCUTANEOUS
  Administered 2018-05-05: 3 [IU] via SUBCUTANEOUS
  Administered 2018-05-05 (×2): 8 [IU] via SUBCUTANEOUS
  Administered 2018-05-06: 3 [IU] via SUBCUTANEOUS
  Administered 2018-05-06: 5 [IU] via SUBCUTANEOUS
  Administered 2018-05-06: 8 [IU] via SUBCUTANEOUS
  Administered 2018-05-06: 3 [IU] via SUBCUTANEOUS
  Administered 2018-05-06: 8 [IU] via SUBCUTANEOUS
  Administered 2018-05-07: 3 [IU] via SUBCUTANEOUS
  Administered 2018-05-07: 5 [IU] via SUBCUTANEOUS

## 2018-04-29 MED ORDER — INSULIN ASPART 100 UNIT/ML ~~LOC~~ SOLN: 0.0000 [IU] | Freq: Every day | SUBCUTANEOUS | Status: DC

## 2018-04-29 MED ORDER — PRO-STAT SUGAR FREE PO LIQD
30.0000 mL | Freq: Every day | ORAL | Status: DC
  Administered 2018-04-30 – 2018-05-02 (×3): 30 mL
  Filled 2018-04-29 (×3): qty 30

## 2018-04-29 MED ORDER — INSULIN ASPART 100 UNIT/ML ~~LOC~~ SOLN
4.0000 [IU] | Freq: Three times a day (TID) | SUBCUTANEOUS | Status: DC
  Administered 2018-04-29 (×3): 4 [IU] via SUBCUTANEOUS

## 2018-04-29 MED ORDER — PRO-STAT SUGAR FREE PO LIQD
30.0000 mL | Freq: Two times a day (BID) | ORAL | Status: DC
  Administered 2018-04-29: 30 mL
  Filled 2018-04-29: qty 30

## 2018-04-29 MED ORDER — ACETAMINOPHEN 160 MG/5ML PO SOLN
650.0000 mg | ORAL | Status: DC | PRN
  Administered 2018-04-29 – 2018-05-02 (×5): 650 mg
  Filled 2018-04-29 (×5): qty 20.3

## 2018-04-29 MED ORDER — VITAL HIGH PROTEIN PO LIQD
1000.0000 mL | ORAL | Status: DC
  Administered 2018-04-30 – 2018-05-03 (×3): 1000 mL

## 2018-04-29 MED ORDER — FUROSEMIDE 10 MG/ML IJ SOLN
40.0000 mg | Freq: Once | INTRAMUSCULAR | Status: AC
  Administered 2018-04-29: 40 mg via INTRAVENOUS
  Filled 2018-04-29: qty 4

## 2018-04-29 NOTE — Progress Notes (Signed)
Follow up - Critical Care Medicine Note  Patient Details:    Dawn Howe is an 72 y.o. female.  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 Traveling from [place] and intubated at  [place] for hypoxic respiratory failure.   Lines, Airways, Drains: Airway 7.5 mm (Active)  Secured at (cm) 25 cm 04/29/2018 11:18 AM  Measured From Lips 04/29/2018 12:00 PM  [place] 04/29/2018 11:18 AM  Secured By [employer] 04/29/2018 12:00 PM  Tube Holder Repositioned Yes 04/29/2018 11:18 AM  Cuff Pressure (cm H2O) 28 cm H2O 04/29/2018  8:03 AM  Site Condition Dry 04/29/2018 12:00 PM     CVC Triple Lumen 04/11/2018 Left Internal jugular (Active)  Indication for Insertion or Continuance of Line Vasoactive infusions 04/29/2018  8:00 AM  Site Assessment Clean;Dry;Intact 04/29/2018  8:00 AM  Proximal Lumen Status Infusing 04/29/2018  8:00 AM  Medial Lumen Status Infusing 04/29/2018  8:00 AM  Distal Lumen Status In-line blood sampling system in place 04/29/2018  8:00 AM  Dressing Type Occlusive 04/29/2018  8:00 AM  Dressing Status Antimicrobial disc in place;Clean;Dry;Intact 04/29/2018  8:00 AM  Line Care Connections checked and tightened 04/29/2018  8:00 AM  Dressing Intervention Other (Comment) 04/29/2018  8:00 AM  Dressing Change Due 05/06/18 04/29/2018  8:00 AM     NG/OG Tube Orogastric Center mouth (Active)  Site Assessment Clean;Dry;Intact 04/29/2018  8:00 AM  Ongoing Placement Verification No change in cm markings or external length of tube from initial placement;No change in respiratory status;No acute changes, not attributed to clinical condition 04/29/2018  8:00 AM  Status Infusing tube feed 04/29/2018 12:00 PM  Drainage Appearance Bile 04/29/2018  3:12 AM  Intake (mL) 0 mL 04/29/2018  3:12 AM  Output (mL) 100 mL 04/29/2018  6:00 AM     Urethral Catheter day RN (Active)  Indication for Insertion or  Continuance of Catheter Unstable critical patients (first 24-48 hours) 04/29/2018  8:00 AM  Site Assessment Clean;Intact 04/29/2018  8:00 AM  Catheter Maintenance Bag below level of bladder;Catheter secured;Drainage bag/tubing not touching floor;Insertion date on drainage bag;No dependent loops;Seal intact 04/29/2018  8:00 AM  Collection Container Standard drainage bag 04/29/2018  8:00 AM  Securement Method Leg strap 04/29/2018  8:00 AM  Urinary Catheter Interventions Unclamped 04/29/2018  8:00 AM  Output (mL) 100 mL 04/29/2018  2:00 PM    Anti-infectives:  Anti-infectives (From admission, onward)   Start     Dose/Rate Route Frequency Ordered Stop   04/20/2018 1630  azithromycin (ZITHROMAX) 500 mg in sodium chloride 0.9 % 250 mL IVPB     500 mg 250 mL/hr over 60 Minutes Intravenous Every 24 hours 04/17/2018 1627     04/03/2018 1630  cefTRIAXone (ROCEPHIN) 2 g in sodium chloride 0.9 % 100 mL IVPB     2 g 200 mL/hr over 30 Minutes Intravenous Every 24 hours 04/11/2018 1627        Microbiology: Results for orders placed or performed during the hospital encounter of 04/13/2018  Culture, respiratory (tracheal aspirate)     Status: None (Preliminary result)   Collection Time: 04/19/2018  4:45 PM  Result Value Ref Range Status   Specimen Description TRACHEAL ASPIRATE  Final   Special Requests NONE  Final   Gram Stain   Final    ABUNDANT WBC PRESENT, PREDOMINANTLY PMN FEW GRAM NEGATIVE RODS Performed at [facility], [place] [address] [place], [place] [address]  Culture MODERATE PSEUDOMONAS AERUGINOSA  Final   Report Status PENDING  Incomplete  MRSA PCR Screening     Status: None   Collection Time: 04/09/2018  4:45 PM  Result Value Ref Range Status   MRSA by PCR NEGATIVE NEGATIVE Final    Comment:        The GeneXpert MRSA Assay (FDA approved for NASAL specimens only), is one component of a comprehensive MRSA colonization surveillance program. It is not intended to diagnose MRSA infection nor  to guide or monitor treatment for MRSA infections. Performed at [facility], [place] [address] [place], [place] [address]   Culture, blood (routine x 2)     Status: None (Preliminary result)   Collection Time: 04/08/2018  5:20 PM  Result Value Ref Range Status   Specimen Description BLOOD RIGHT ANTECUBITAL  Final   Special Requests   Final    BOTTLES DRAWN AEROBIC ONLY Blood Culture adequate volume   Culture   Final    NO GROWTH < 24 HOURS Performed at [facility], [place] [address] [place], [place] [address]    Report Status PENDING  Incomplete  Culture, blood (routine x 2)     Status: None (Preliminary result)   Collection Time: 04/11/2018  5:25 PM  Result Value Ref Range Status   Specimen Description BLOOD LEFT ANTECUBITAL  Final   Special Requests   Final    BOTTLES DRAWN AEROBIC ONLY Blood Culture adequate volume   Culture   Final    NO GROWTH < 24 HOURS Performed at [facility], [place] [address], [place], [place] [address]    Report Status PENDING  Incomplete  Urine culture     Status: None   Collection Time: 04/27/2018  5:51 PM  Result Value Ref Range Status   Specimen Description URINE, RANDOM  Final   Special Requests NONE  Final   Culture   Final    NO GROWTH Performed at [facility], [address], [place], [place] [address]    Report Status 04/29/2018 FINAL  Final   BMET    Component Value Date/Time   NA 144 05/01/2018 1754   K 4.1 04/05/2018 1754   CL 113 (H) 04/10/2018 1754   CO2 22 04/12/2018 1754   GLUCOSE 124 (H) 04/26/2018 1754   BUN 11 04/19/2018 1754   CREATININE 0.90 04/23/2018 1754   CALCIUM 7.5 (L) 04/14/2018 1754   GFRNONAA >60 04/24/2018 1754   GFRAA >60 04/10/2018 1754   CBC    Component Value Date/Time   WBC 21.6 (H) 04/29/2018 0437   RBC 4.40 04/29/2018 0437   HGB 12.7 04/29/2018 0437   HCT 41.9 04/29/2018 0437   PLT 372 04/29/2018 0437   MCV 95.2 04/29/2018 0437   MCH 28.9 04/29/2018 0437   MCHC 30.3  04/29/2018 0437   RDW 13.5 04/29/2018 0437   LYMPHSABS 3.0 04/02/2018 1754   MONOABS 0.8 04/21/2018 1754   EOSABS 0.1 04/18/2018 1754   BASOSABS 0.1 04/08/2018 1754    Best Practice/Protocols:  VTE Prophylaxis: Lovenox (prophylaxtic dose) ARDS  Events: Dexmedetomidine added, patient less agitated and better synchronized with  ventilator  Intubated 04/22/2018 Troponin elevated - echo shows EF 20% with global hypokinesis. Cardiology consulted.  Studies: Dg Chest Port 1 View  Result Date: 04/09/2018 CLINICAL DATA:  Endotracheal tube. EXAM: PORTABLE CHEST 1 VIEW COMPARISON:  Chest CT from earlier today FINDINGS: Endotracheal tube tip between the clavicular heads and carina. Tip projects  nearly 3 cm above the carina. Left IJ line with tip near the SVC origin. New orogastric tube reaches the stomach. Large lung volumes with emphysema. Perihilar masslike abnormality with adenopathy by recent chest CT. IMPRESSION: 1. New orogastric tube reaches the stomach. 2. Otherwise stable chest as characterized by CT earlier today. Electronically Signed   By: Monte Fantasia M.D.   On: 04/02/2018 16:55    Consults: Treatment Team:  Lbcardiology, Michae Kava, MD   Subjective:    Overnight Issues: currently intubated. Periods of agitation with sedation related hypotension.  Objective:  Vital signs for last 24 hours: Temp:  [99.5 F (37.5 C)-101.5 F (38.6 C)] 100.8 F (38.2 C) (06/28 1400) Pulse Rate:  [94-125] 98 (06/28 1400) Resp:  [18-26] 26 (06/28 1400) BP: (79-139)/(60-95) 116/72 (06/28 1400) SpO2:  [93 %-100 %] 98 % (06/28 1400) Arterial Line BP: (82-167)/(50-89) 127/67 (06/28 1400) FiO2 (%):  [40 %-60 %] 40 % (06/28 1419) Weight:  [186 lb 11.7 oz (84.7 kg)-187 lb 13.3 oz (85.2 kg)] 186 lb 11.7 oz (84.7 kg) (06/28 0455)  Hemodynamic parameters for last 24 hours: CVP:  [11 mmHg-20 mmHg] 18 mmHg  Intake/Output from previous day: 06/27 0701 - 06/28 0700 In: 462 [I.V.:161.6; IV  Piggyback:300.3] Out: 610 [Urine:510; Emesis/NG output:100]  Intake/Output this shift: Total I/O In: 150 [I.V.:93.3; NG/GT:6.7; IV Piggyback:50] Out: 255 [Urine:255]  Vent settings for last 24 hours: Vent Mode: PRVC FiO2 (%):  [40 %-60 %] 40 % Set Rate:  [26 bmp] 26 bmp Vt Set:  [430 mL] 430 mL PEEP:  [5 cmH20] 5 cmH20 Plateau Pressure:  [19 cmH20-22 cmH20] 19 cmH20  Physical Exam:  General: appears older than stated age.  Neuro: responds to voice but not able to follow commands on fentanyl infusion. Moves all limbs. HEENT/Neck: endotracheal tube in place. Resp: PRVC ventilation. No ventilator dyssynchrony. Chest clear to auscultation. CVS: JVP not elevated. S1,S2 normal. Extremities warm. GI: soft,not tender. Skin: line sites clean. Extremities: no edema.  Bedside echo performed by myself: improved LVSF.  Normal RV function. Euvolemic.  Assessment/Plan:   NEURO  Agitated delirium.    Plan: Continue fentanyl. Add dexmedetomidine.  PULM  Acute respiratory failure.  Etiology unclear - possible component of pulmonary edema on possible metastatic spread and underlying COPD.   Plan: Continue lung protective ventilation.  CARDIO  Stress cardiomyopathy. Degree of troponin elevation out of proportion degree of LV dysfunction. Sedation related hypotension.    Plan: stop ACS heparin.   RENAL  Normal renal function. Negative fluid balance   Plan: Keep even to negative balance.  GI  Moderate nutritional risk.   Plan: Initiate enteral nutrition  ID  Possible community acquired pneumonia.   Plan: Ear antibiotic coverage pending culture results.  HEME  Leukocytosis has not improved.  High DVT risk   Plan: Lovenox for DVT prophylaxis.  ENDO  type 2 diabetes with stress hyperglycemia.  Proven glycemic control.   Plan: Continue current insulin regimen.  Global Issues   Overall improvement but prognosis remains guarded as status of underlying cancer is unknown.   LOS: 1 day    Additional comments: Friend updated bedside.  She informed me that her husband and daughter are on her way here.  Critical Care Total Time*: 50  Lucifer Soja 04/29/2018  *Care during the described time interval was provided by me and/or other providers on the critical care team.  I have reviewed this patient's available data, including medical history, events of note, physical examination and test results  as part of my evaluation.

## 2018-04-29 NOTE — Progress Notes (Signed)
ANTICOAGULATION CONSULT NOTE - Follow Up Consult  Pharmacy Consult for heparin Indication: NSTEMI  Labs: Recent Labs    04/26/2018 1754 04/06/2018 2206 04/29/18 0437  HGB 11.8*  --  12.7  HCT 39.2  --  41.9  PLT 330  --  372  LABPROT 14.6  --   --   INR 1.15  --   --   HEPARINUNFRC  --   --  0.60  CREATININE 0.90  --   --   TROPONINI 0.99* 0.64*  --     Assessment/Plan:  72yo female therapeutic on heparin with initial dosing for ACS. Will continue gtt at current rate and confirm stable with additional level.   Wynona Neat, PharmD, BCPS  04/29/2018,5:46 AM

## 2018-04-29 NOTE — Progress Notes (Signed)
Progress Note  Patient Name: Dawn Howe Date of Encounter: 04/29/2018  Primary Cardiologist: No primary care provider on file.   Subjective   Intubated sedated BP improved   Inpatient Medications    Scheduled Meds: . chlorhexidine gluconate (MEDLINE KIT)  15 mL Mouth Rinse BID  . fentaNYL (SUBLIMAZE) injection  50 mcg Intravenous Once  . insulin aspart  0-15 Units Subcutaneous TID WC  . insulin aspart  0-5 Units Subcutaneous QHS  . insulin aspart  4 Units Subcutaneous TID WC  . ipratropium-albuterol  3 mL Nebulization Q6H  . mouth rinse  15 mL Mouth Rinse 10 times per day  . methylPREDNISolone (SOLU-MEDROL) injection  80 mg Intravenous Q12H   Continuous Infusions: . sodium chloride    . azithromycin Stopped (04/22/2018 1917)  . cefTRIAXone (ROCEPHIN)  IV 2 g (04/09/2018 1736)  . famotidine (PEPCID) IV Stopped (04/07/2018 2232)  . fentaNYL infusion INTRAVENOUS 200 mcg/hr (04/29/18 0442)  . heparin 1,000 Units/hr (04/04/2018 1744)  . norepinephrine (LEVOPHED) Adult infusion 14 mcg/min (04/29/18 0600)   PRN Meds: sodium chloride, acetaminophen (TYLENOL) oral liquid 160 mg/5 mL, albuterol, fentaNYL, midazolam, midazolam   Vital Signs    Vitals:   04/29/18 0600 04/29/18 0700 04/29/18 0800 04/29/18 0801  BP: 114/72 111/71 115/70   Pulse: (!) 104 (!) 107 (!) 106   Resp: (!) 26 (!) 26 (!) 26   Temp: (!) 101.3 F (38.5 C) (!) 101.5 F (38.6 C) (!) 100.8 F (38.2 C)   TempSrc:      SpO2: 98% 97% 96% 97%  Weight:        Intake/Output Summary (Last 24 hours) at 04/29/2018 0808 Last data filed at 04/29/2018 0600 Gross per 24 hour  Intake 461.96 ml  Output 610 ml  Net -148.04 ml   Filed Weights   04/30/2018 1630 04/29/18 0455  Weight: 187 lb 13.3 oz (85.2 kg) 186 lb 11.7 oz (84.7 kg)    Telemetry    NSR 04/29/2018  - Personally Reviewed  ECG    SR non specific ST changes  - Personally Reviewed  Physical Exam  Intubated sedated  Triple lumen left IJ  GEN: No  acute distress.   Neck: No JVD Cardiac: RRR, SEM  murmurs, rubs, or gallops.  Respiratory: rhonchi over vent  GI: Soft, nontender, non-distended  MS: No edema; No deformity. Neuro:  Nonfocal  Psych: Normal affect   Labs    Chemistry Recent Labs  Lab 04/20/2018 1754  NA 144  K 4.1  CL 113*  CO2 22  GLUCOSE 124*  BUN 11  CREATININE 0.90  CALCIUM 7.5*  PROT 5.6*  ALBUMIN 3.1*  AST 88*  ALT 84*  ALKPHOS 54  BILITOT 0.3  GFRNONAA >60  GFRAA >60  ANIONGAP 9     Hematology Recent Labs  Lab 04/16/2018 1754 04/29/18 0437  WBC 11.9* 21.6*  RBC 4.19 4.40  HGB 11.8* 12.7  HCT 39.2 41.9  MCV 93.6 95.2  MCH 28.2 28.9  MCHC 30.1 30.3  RDW 13.6 13.5  PLT 330 372    Cardiac Enzymes Recent Labs  Lab 04/24/2018 1754 04/29/2018 2206 04/29/18 0437  TROPONINI 0.99* 0.64* 0.51*   No results for input(s): TROPIPOC in the last 168 hours.   BNPNo results for input(s): BNP, PROBNP in the last 168 hours.   DDimer No results for input(s): DDIMER in the last 168 hours.   Radiology    Dg Chest Port 1 View  Result Date: 04/27/2018 CLINICAL DATA:  Endotracheal tube. EXAM: PORTABLE CHEST 1 VIEW COMPARISON:  Chest CT from earlier today FINDINGS: Endotracheal tube tip between the clavicular heads and carina. Tip projects nearly 3 cm above the carina. Left IJ line with tip near the SVC origin. New orogastric tube reaches the stomach. Large lung volumes with emphysema. Perihilar masslike abnormality with adenopathy by recent chest CT. IMPRESSION: 1. New orogastric tube reaches the stomach. 2. Otherwise stable chest as characterized by CT earlier today. Electronically Signed   By: Monte Fantasia M.D.   On: 04/12/2018 16:55    Cardiac Studies   Oval Linsey TTE EF 20-25% diffuse hypokinesis   Patient Profile     72 y.o. female transferred from [place]. Metastatic lung cancer ? Mets to pulmonary arteries And lymphangitic spread. Intubated for acidotic respiratory failure CO2 100 Peak  troponin 6 with TTE showing diffuse hypokinesis EF 20-25%. No acute ECG changes  Assessment & Plan    1. SEMI:  Likely Takatsubo type physiology troponin down ECG ok has had previous chemo For her cancer not clear what baseline EF was. No indication for urgent cath  2. Pulmonary:  Primary issue on vent need to figure out course of he cancer Her friend traveling with Her indicated PET scan in May was ok and she has not had active Rx for lung cancer in quite some time But CT here suggests lymphangitic spread and pulmonary mets.  3. Sepsis:  Continue levophed and antibiotics improved   For questions or updates, please contact [place] Please consult [email or web address] for contact info under Cardiology/STEMI.      Signed, Jenkins Rouge, MD  04/29/2018, 8:08 AM

## 2018-04-29 NOTE — Progress Notes (Signed)
eLink Physician-Brief Progress Note Patient Name: Dawn Howe DOB: 04/02/1946 MRN: [id number]   Date of Service  04/29/2018  HPI/Events of Note  Hyperglycemia  eICU Interventions  Moderate sliding scale insulin regimen        Frederik Pear 04/29/2018, 6:13 AM

## 2018-04-29 NOTE — Progress Notes (Signed)
Initial Nutrition Assessment  DOCUMENTATION CODES:   Obesity unspecified  INTERVENTION:   Tube Feeding:  Vital High Protein @ 45 ml/hr Pro-Stat 30 mL daily Provides 1180 kcals, 110 g of protein and 875 mL of free water  NUTRITION DIAGNOSIS:   Inadequate oral intake related to acute illness as evidenced by NPO status.  GOAL:   Provide needs based on ASPEN/SCCM guidelines  MONITOR:   Vent status, TF tolerance, Labs, Weight trends  REASON FOR ASSESSMENT:   Consult, Ventilator Enteral/tube feeding initiation and management  ASSESSMENT:   72 yo female presenting from [facility] where she was intubated in ER for admitted with acute on chronic respiratory failure. Pt transferred to [facility] for probable NSTEMI and cardiology evaluation. Pt with AECOPD, possible postobstructive pneumonia Pt with hx of COPD on 2L [place] at baseline, CAD, HTN, lung cancer s/p radiation and chemo. ECHO with EF 20%  Pt remains on vent support  CT chest negative for PE but does show hilar lung mass with b/l pulmonary artery branch involvement and possible lymphangitic spread of tumor  OG tube in stomach  No family at bedside; unable to obtain diet or weight history on visit.   Labs: CBGs 176-224, HgbA1c 6.6 Meds: solumedrol, ss novolog  NUTRITION - FOCUSED PHYSICAL EXAM:    Most Recent Value  Orbital Region  No depletion  Upper Arm Region  No depletion  Thoracic and Lumbar Region  No depletion  Buccal Region  No depletion  Temple Region  No depletion  Clavicle Bone Region  No depletion  Clavicle and Acromion Bone Region  No depletion  Scapular Bone Region  No depletion  Dorsal Hand  No depletion  Patellar Region  No depletion  Anterior Thigh Region  No depletion  Posterior Calf Region  No depletion  Edema (RD Assessment)  Moderate       Diet Order:   Diet Order           Diet NPO time specified  Diet effective now          EDUCATION NEEDS:   Not appropriate for  education at this time  Skin:  Skin Assessment: Reviewed RN Assessment  Last BM:  no documented BM  Height:   Ht Readings from Last 1 Encounters:  04/29/18 5\' 4"  (1.626 m)    Weight:   Wt Readings from Last 1 Encounters:  04/29/18 186 lb 11.7 oz (84.7 kg)    Ideal Body Weight:  54.5 kg  BMI:  Body mass index is 32.05 kg/m.  Estimated Nutritional Needs:   Kcal:  [phone] kcals  Protein:  110-120 g  Fluid:  >/= 1.5 L   Kerman Passey MS, RD, LDN, CNSC [phone] Pager  [phone] Weekend/On-Call Pager

## 2018-04-30 ENCOUNTER — Other Ambulatory Visit (HOSPITAL_COMMUNITY): Payer: Medicare Other

## 2018-04-30 LAB — CULTURE, RESPIRATORY

## 2018-04-30 LAB — GLUCOSE, CAPILLARY
GLUCOSE-CAPILLARY: 150 mg/dL — AB (ref 70–99)
GLUCOSE-CAPILLARY: 184 mg/dL — AB (ref 70–99)
GLUCOSE-CAPILLARY: 190 mg/dL — AB (ref 70–99)
GLUCOSE-CAPILLARY: 212 mg/dL — AB (ref 70–99)
Glucose-Capillary: 130 mg/dL — ABNORMAL HIGH (ref 70–99)
Glucose-Capillary: 182 mg/dL — ABNORMAL HIGH (ref 70–99)
Glucose-Capillary: 226 mg/dL — ABNORMAL HIGH (ref 70–99)

## 2018-04-30 LAB — CULTURE, RESPIRATORY W GRAM STAIN

## 2018-04-30 LAB — CBC
HCT: 34.9 % — ABNORMAL LOW (ref 36.0–46.0)
Hemoglobin: 10.6 g/dL — ABNORMAL LOW (ref 12.0–15.0)
MCH: 28.3 pg (ref 26.0–34.0)
MCHC: 30.4 g/dL (ref 30.0–36.0)
MCV: 93.3 fL (ref 78.0–100.0)
PLATELETS: 241 10*3/uL (ref 150–400)
RBC: 3.74 MIL/uL — ABNORMAL LOW (ref 3.87–5.11)
RDW: 13.4 % (ref 11.5–15.5)
WBC: 14.5 10*3/uL — AB (ref 4.0–10.5)

## 2018-04-30 LAB — PHOSPHORUS
PHOSPHORUS: 2.1 mg/dL — AB (ref 2.5–4.6)
Phosphorus: 2.4 mg/dL — ABNORMAL LOW (ref 2.5–4.6)

## 2018-04-30 LAB — MAGNESIUM
MAGNESIUM: 1.9 mg/dL (ref 1.7–2.4)
Magnesium: 2 mg/dL (ref 1.7–2.4)

## 2018-04-30 MED ORDER — ENOXAPARIN SODIUM 40 MG/0.4ML ~~LOC~~ SOLN
40.0000 mg | SUBCUTANEOUS | Status: DC
  Administered 2018-04-30 – 2018-05-15 (×15): 40 mg via SUBCUTANEOUS
  Filled 2018-04-30 (×17): qty 0.4

## 2018-04-30 MED ORDER — SODIUM CHLORIDE 0.9 % IV SOLN
1.0000 g | Freq: Three times a day (TID) | INTRAVENOUS | Status: AC
  Administered 2018-04-30 – 2018-05-06 (×20): 1 g via INTRAVENOUS
  Filled 2018-04-30 (×21): qty 1

## 2018-04-30 NOTE — Progress Notes (Signed)
Follow up - Critical Care Medicine Note  Patient Details:    Dawn Howe is an 72 y.o. female.  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 Traveling from [place] and intubated at  [facility] for hypoxic respiratory failure.   Lines, Airways, Drains: Airway 7.5 mm (Active)  Secured at (cm) 25 cm 04/29/2018 11:18 AM  Measured From Lips 04/29/2018 12:00 PM  [place] 04/29/2018 11:18 AM  Secured By [employer] 04/29/2018 12:00 PM  Tube Holder Repositioned Yes 04/29/2018 11:18 AM  Cuff Pressure (cm H2O) 28 cm H2O 04/29/2018  8:03 AM  Site Condition Dry 04/29/2018 12:00 PM     CVC Triple Lumen 04/22/2018 Left Internal jugular (Active)  Indication for Insertion or Continuance of Line Vasoactive infusions 04/29/2018  8:00 AM  Site Assessment Clean;Dry;Intact 04/29/2018  8:00 AM  Proximal Lumen Status Infusing 04/29/2018  8:00 AM  Medial Lumen Status Infusing 04/29/2018  8:00 AM  Distal Lumen Status In-line blood sampling system in place 04/29/2018  8:00 AM  Dressing Type Occlusive 04/29/2018  8:00 AM  Dressing Status Antimicrobial disc in place;Clean;Dry;Intact 04/29/2018  8:00 AM  Line Care Connections checked and tightened 04/29/2018  8:00 AM  Dressing Intervention Other (Comment) 04/29/2018  8:00 AM  Dressing Change Due 05/06/18 04/29/2018  8:00 AM     NG/OG Tube Orogastric Center mouth (Active)  Site Assessment Clean;Dry;Intact 04/29/2018  8:00 AM  Ongoing Placement Verification No change in cm markings or external length of tube from initial placement;No change in respiratory status;No acute changes, not attributed to clinical condition 04/29/2018  8:00 AM  Status Infusing tube feed 04/29/2018 12:00 PM  Drainage Appearance Bile 04/29/2018  3:12 AM  Intake (mL) 0 mL 04/29/2018  3:12 AM  Output (mL) 100 mL 04/29/2018  6:00 AM     Urethral Catheter day RN (Active)  Indication for Insertion or  Continuance of Catheter Unstable critical patients (first 24-48 hours) 04/29/2018  8:00 AM  Site Assessment Clean;Intact 04/29/2018  8:00 AM  Catheter Maintenance Bag below level of bladder;Catheter secured;Drainage bag/tubing not touching floor;Insertion date on drainage bag;No dependent loops;Seal intact 04/29/2018  8:00 AM  Collection Container Standard drainage bag 04/29/2018  8:00 AM  Securement Method Leg strap 04/29/2018  8:00 AM  Urinary Catheter Interventions Unclamped 04/29/2018  8:00 AM  Output (mL) 100 mL 04/29/2018  2:00 PM    Anti-infectives:  Anti-infectives (From admission, onward)   Start     Dose/Rate Route Frequency Ordered Stop   04/08/2018 1630  azithromycin (ZITHROMAX) 500 mg in sodium chloride 0.9 % 250 mL IVPB     500 mg 250 mL/hr over 60 Minutes Intravenous Every 24 hours 04/23/2018 1627     04/22/2018 1630  cefTRIAXone (ROCEPHIN) 2 g in sodium chloride 0.9 % 100 mL IVPB     2 g 200 mL/hr over 30 Minutes Intravenous Every 24 hours 04/03/2018 1627        Microbiology: Results for orders placed or performed during the hospital encounter of 04/05/2018  Culture, respiratory (tracheal aspirate)     Status: None (Preliminary result)   Collection Time: 04/13/2018  4:45 PM  Result Value Ref Range Status   Specimen Description TRACHEAL ASPIRATE  Final   Special Requests NONE  Final   Gram Stain   Final    ABUNDANT WBC PRESENT, PREDOMINANTLY PMN FEW GRAM NEGATIVE RODS    Culture MODERATE PSEUDOMONAS AERUGINOSA  Final   Report Status  PENDING  Incomplete   Organism ID, Bacteria PSEUDOMONAS AERUGINOSA  Final      Susceptibility   Pseudomonas aeruginosa - MIC*    CEFTAZIDIME 4 SENSITIVE Sensitive     CIPROFLOXACIN <=0.25 SENSITIVE Sensitive     GENTAMICIN <=1 SENSITIVE Sensitive     IMIPENEM 1 SENSITIVE Sensitive     PIP/TAZO 8 SENSITIVE Sensitive     CEFEPIME Value in next row Sensitive      2 SENSITIVEPerformed at [facility] [address] [place], [place] [address]     * MODERATE PSEUDOMONAS AERUGINOSA  MRSA PCR Screening     Status: None   Collection Time: 04/24/2018  4:45 PM  Result Value Ref Range Status   MRSA by PCR NEGATIVE NEGATIVE Final    Comment:        The GeneXpert MRSA Assay (FDA approved for NASAL specimens only), is one component of a comprehensive MRSA colonization surveillance program. It is not intended to diagnose MRSA infection nor to guide or monitor treatment for MRSA infections. Performed at [facility], [place] [address] [place], [place] [address]   Culture, blood (routine x 2)     Status: None (Preliminary result)   Collection Time: 04/26/2018  5:20 PM  Result Value Ref Range Status   Specimen Description BLOOD RIGHT ANTECUBITAL  Final   Special Requests   Final    BOTTLES DRAWN AEROBIC ONLY Blood Culture adequate volume   Culture   Final    NO GROWTH 2 DAYS Performed at [facility], [place] [address] [place], [place] [address]    Report Status PENDING  Incomplete  Culture, blood (routine x 2)     Status: None (Preliminary result)   Collection Time: 04/11/2018  5:25 PM  Result Value Ref Range Status   Specimen Description BLOOD LEFT ANTECUBITAL  Final   Special Requests   Final    BOTTLES DRAWN AEROBIC ONLY Blood Culture adequate volume   Culture   Final    NO GROWTH 2 DAYS Performed at [facility], [place] [address], [place], [place] [address]    Report Status PENDING  Incomplete  Urine culture     Status: None   Collection Time: 04/17/2018  5:51 PM  Result Value Ref Range Status   Specimen Description URINE, RANDOM  Final   Special Requests NONE  Final   Culture   Final    NO GROWTH Performed at [facility], [address], [place], [place] [address]    Report Status 04/29/2018 FINAL  Final   BMET    Component Value Date/Time   NA 144 04/25/2018 1754   K 4.1 04/10/2018 1754   CL 113 (H) 04/06/2018 1754   CO2 22 04/18/2018 1754   GLUCOSE 124 (H) 04/20/2018 1754   BUN 11 04/29/2018 1754    CREATININE 0.90 04/27/2018 1754   CALCIUM 7.5 (L) 04/21/2018 1754   GFRNONAA >60 04/13/2018 1754   GFRAA >60 04/17/2018 1754   CBC    Component Value Date/Time   WBC 14.5 (H) 04/30/2018 0444   RBC 3.74 (L) 04/30/2018 0444   HGB 10.6 (L) 04/30/2018 0444   HCT 34.9 (L) 04/30/2018 0444   PLT 241 04/30/2018 0444   MCV 93.3 04/30/2018 0444   MCH 28.3 04/30/2018 0444   MCHC 30.4 04/30/2018 0444   RDW 13.4 04/30/2018 0444   LYMPHSABS 3.0 04/07/2018 1754   MONOABS 0.8 04/02/2018 1754   EOSABS 0.1 04/10/2018  1754   BASOSABS 0.1 04/29/2018 1754    Best Practice/Protocols:  VTE Prophylaxis: Lovenox (prophylaxtic dose) ARDS  Events: Dexmedetomidine added, patient less agitated and better synchronized with  ventilator  Intubated 04/12/2018 Troponin elevated - echo shows EF 20% with global hypokinesis. Cardiology consulted.  Studies: Dg Chest Port 1 View  Result Date: 04/09/2018 CLINICAL DATA:  Endotracheal tube. EXAM: PORTABLE CHEST 1 VIEW COMPARISON:  Chest CT from earlier today FINDINGS: Endotracheal tube tip between the clavicular heads and carina. Tip projects nearly 3 cm above the carina. Left IJ line with tip near the SVC origin. New orogastric tube reaches the stomach. Large lung volumes with emphysema. Perihilar masslike abnormality with adenopathy by recent chest CT. IMPRESSION: 1. New orogastric tube reaches the stomach. 2. Otherwise stable chest as characterized by CT earlier today. Electronically Signed   By: Monte Fantasia M.D.   On: 04/21/2018 16:55    Consults: Treatment Team:  Lbcardiology, Michae Kava, MD   Subjective:    Overnight Issues: currently intubated. Periods of agitation with sedation related hypotension.  Decreasing fentanyl to improve respiratory drive.  Objective:  Vital signs for last 24 hours: Temp:  [97.8 F (36.6 C)-101.3 F (38.5 C)] 98.6 F (37 C) (06/29 0800) Pulse Rate:  [90-125] 123 (06/29 0815) Resp:  [22-26] 26 (06/29 0815) BP:  (89-170)/(58-81) 170/81 (06/29 0815) SpO2:  [93 %-98 %] 97 % (06/29 0800) Arterial Line BP: (89-160)/(50-73) 89/50 (06/29 0800) FiO2 (%):  [40 %] 40 % (06/29 0854) Weight:  [184 lb 11.9 oz (83.8 kg)] 184 lb 11.9 oz (83.8 kg) (06/29 0411)  Hemodynamic parameters for last 24 hours: CVP:  [10 mmHg-15 mmHg] 15 mmHg  Intake/Output from previous day: 06/28 0701 - 06/29 0700 In: 1697.9 [I.V.:821.2; NG/GT:526.7; IV Piggyback:350] Out: 2255 [Urine:2255]  Intake/Output this shift: Total I/O In: 73.4 [I.V.:33.4; NG/GT:40] Out: -   Vent settings for last 24 hours: Vent Mode: PRVC FiO2 (%):  [40 %] 40 % Set Rate:  [26 bmp] 26 bmp Vt Set:  [430 mL] 430 mL PEEP:  [5 cmH20] 5 cmH20 Plateau Pressure:  [19 cmH20-46 cmH20] 46 cmH20  Physical Exam:  General: appears older than stated age.  Neuro: responds to voice and able to follow simple commands. Moves all limbs with generalized weakness. Inattention +. HEENT/Neck: endotracheal tube in place. Resp: PRVC ventilation. Periodic ventilator dyssynchrony. Taking appropriate Vt on PSV 10 but periods of apnea. Vesicular breathing throughout with wheezing.  CVS: JVP not elevated. S1,S2 normal. Extremities warm. GI: soft,not tender. Skin: line sites clean. Extremities: no edema.  Bedside echo performed by myself: improved LVSF.  Normal RV function. Euvolemic.  Assessment/Plan:   NEURO  Agitated delirium.    Plan: Wean fentanyl and continue dexmedetomidine to improve respiratory drive.  PULM  Acute hypoxic respiratory failure due to Pseudomonal pneumonia on background of COPD and prior lung cancer. Signs of improvement - now ready for SBT, but ventilator synchrony limiting factor.   Plan: Continue lung protective ventilation. Begin SBT.   CARDIO  Stress cardiomyopathy. Degree of troponin elevation out of proportion degree of LV dysfunction. Sedation related hypotension.    Plan: stop ACS heparin.  Repeat echocardiogram pending.  RENAL  Normal  renal function. Negative fluid balance   Plan: Keep even to negative balance.  GI  Moderate nutritional risk.   Plan: Continue enteral nutrition  ID  Pseudomonal pneumonia.   Plan: Tailor antibiotics to sputum isolate. Start ceftazidime.  HEME  Leukocytosis improving.  High DVT risk.   Plan:  Lovenox for DVT prophylaxis.  ENDO  type 2 diabetes with stress hyperglycemia.  Improving glycemic control.   Plan: Continue current insulin regimen.  Global Issues   Overall improvement but prognosis remains guarded as status of underlying cancer is unknown.   LOS: 2 days   Additional comments: Husband updated at bedside today.  Critical Care Total Time*: [place] 04/30/2018  *Care during the described time interval was provided by me and/or other providers on the critical care team.  I have reviewed this patient's available data, including medical history, events of note, physical examination and test results as part of my evaluation.

## 2018-05-01 ENCOUNTER — Inpatient Hospital Stay (HOSPITAL_COMMUNITY): Payer: Medicare Other

## 2018-05-01 DIAGNOSIS — I313 Pericardial effusion (noninflammatory): Secondary | ICD-10-CM

## 2018-05-01 DIAGNOSIS — I340 Nonrheumatic mitral (valve) insufficiency: Secondary | ICD-10-CM

## 2018-05-01 DIAGNOSIS — J9601 Acute respiratory failure with hypoxia: Secondary | ICD-10-CM

## 2018-05-01 LAB — POCT I-STAT 3, ART BLOOD GAS (G3+)
ACID-BASE EXCESS: 2 mmol/L (ref 0.0–2.0)
BICARBONATE: 27.4 mmol/L (ref 20.0–28.0)
Bicarbonate: 27.7 mmol/L (ref 20.0–28.0)
O2 SAT: 96 %
O2 Saturation: 98 %
PCO2 ART: 56.6 mmHg — AB (ref 32.0–48.0)
PH ART: 7.353 (ref 7.350–7.450)
TCO2: 29 mmol/L (ref 22–32)
TCO2: 29 mmol/L (ref 22–32)
pCO2 arterial: 49.6 mmHg — ABNORMAL HIGH (ref 32.0–48.0)
pH, Arterial: 7.293 — ABNORMAL LOW (ref 7.350–7.450)
pO2, Arterial: 95 mmHg (ref 83.0–108.0)
pO2, Arterial: 117 mmHg — ABNORMAL HIGH (ref 83.0–108.0)

## 2018-05-01 LAB — BASIC METABOLIC PANEL WITH GFR
Anion gap: 6 (ref 5–15)
BUN: 28 mg/dL — ABNORMAL HIGH (ref 8–23)
CO2: 27 mmol/L (ref 22–32)
Calcium: 8.4 mg/dL — ABNORMAL LOW (ref 8.9–10.3)
Chloride: 110 mmol/L (ref 98–111)
Creatinine, Ser: 0.72 mg/dL (ref 0.44–1.00)
GFR calc Af Amer: >60 mL/min
GFR calc non Af Amer: >60 mL/min
Glucose, Bld: 159 mg/dL — ABNORMAL HIGH (ref 70–99)
Potassium: 4.5 mmol/L (ref 3.5–5.1)
Sodium: 143 mmol/L (ref 135–145)

## 2018-05-01 LAB — GLUCOSE, CAPILLARY
GLUCOSE-CAPILLARY: 127 mg/dL — AB (ref 70–99)
Glucose-Capillary: 133 mg/dL — ABNORMAL HIGH (ref 70–99)
Glucose-Capillary: 145 mg/dL — ABNORMAL HIGH (ref 70–99)
Glucose-Capillary: 167 mg/dL — ABNORMAL HIGH (ref 70–99)
Glucose-Capillary: 174 mg/dL — ABNORMAL HIGH (ref 70–99)
Glucose-Capillary: 189 mg/dL — ABNORMAL HIGH (ref 70–99)
Glucose-Capillary: 205 mg/dL — ABNORMAL HIGH (ref 70–99)
Glucose-Capillary: 225 mg/dL — ABNORMAL HIGH (ref 70–99)

## 2018-05-01 LAB — CBC
HEMATOCRIT: 34 % — AB (ref 36.0–46.0)
Hemoglobin: 10.1 g/dL — ABNORMAL LOW (ref 12.0–15.0)
MCH: 28 pg (ref 26.0–34.0)
MCHC: 29.7 g/dL — ABNORMAL LOW (ref 30.0–36.0)
MCV: 94.2 fL (ref 78.0–100.0)
PLATELETS: 208 10*3/uL (ref 150–400)
RBC: 3.61 MIL/uL — AB (ref 3.87–5.11)
RDW: 13.5 % (ref 11.5–15.5)
WBC: 11.2 10*3/uL — AB (ref 4.0–10.5)

## 2018-05-01 MED ORDER — DEXAMETHASONE SODIUM PHOSPHATE 4 MG/ML IJ SOLN
6.0000 mg | Freq: Four times a day (QID) | INTRAMUSCULAR | Status: DC
  Administered 2018-05-01 – 2018-05-04 (×13): 6 mg via INTRAVENOUS
  Filled 2018-05-01 (×13): qty 2

## 2018-05-01 MED ORDER — RACEPINEPHRINE HCL 2.25 % IN NEBU
INHALATION_SOLUTION | RESPIRATORY_TRACT | Status: AC
  Filled 2018-05-01: qty 0.5

## 2018-05-01 MED ORDER — PAROXETINE HCL 30 MG PO TABS
50.0000 mg | ORAL_TABLET | Freq: Every day | ORAL | Status: DC
  Administered 2018-05-01 – 2018-05-02 (×2): 50 mg
  Filled 2018-05-01 (×4): qty 1

## 2018-05-01 MED ORDER — MIRTAZAPINE 30 MG PO TBDP
30.0000 mg | ORAL_TABLET | Freq: Every day | ORAL | Status: DC
  Administered 2018-05-01 – 2018-05-02 (×2): 30 mg
  Filled 2018-05-01 (×2): qty 1

## 2018-05-01 MED ORDER — SENNOSIDES-DOCUSATE SODIUM 8.6-50 MG PO TABS
1.0000 | ORAL_TABLET | Freq: Two times a day (BID) | ORAL | Status: DC | PRN
  Administered 2018-05-01: 1
  Filled 2018-05-01: qty 1

## 2018-05-01 MED ORDER — SUCCINYLCHOLINE CHLORIDE 20 MG/ML IJ SOLN: 100.0000 mg | Freq: Once | INTRAMUSCULAR | Status: DC

## 2018-05-01 MED ORDER — QUETIAPINE FUMARATE 100 MG PO TABS
100.0000 mg | ORAL_TABLET | Freq: Two times a day (BID) | ORAL | Status: DC
  Administered 2018-05-01 – 2018-05-02 (×4): 100 mg
  Filled 2018-05-01 (×4): qty 1

## 2018-05-01 MED ORDER — RACEPINEPHRINE HCL 2.25 % IN NEBU
0.5000 mL | INHALATION_SOLUTION | Freq: Once | RESPIRATORY_TRACT | Status: AC
  Administered 2018-05-01: 0.5 mL via RESPIRATORY_TRACT

## 2018-05-01 MED ORDER — POLYETHYLENE GLYCOL 3350 17 G PO PACK: 17.0000 g | PACK | Freq: Two times a day (BID) | ORAL | Status: DC | PRN

## 2018-05-01 MED ORDER — MIDAZOLAM HCL 2 MG/2ML IJ SOLN
2.0000 mg | Freq: Once | INTRAMUSCULAR | Status: AC
  Administered 2018-05-01: 2 mg via INTRAVENOUS
  Filled 2018-05-01: qty 2

## 2018-05-01 MED ORDER — ETOMIDATE 2 MG/ML IV SOLN: 20.0000 mg | Freq: Once | INTRAVENOUS | Status: DC

## 2018-05-01 MED ORDER — FUROSEMIDE 10 MG/ML IJ SOLN
20.0000 mg | Freq: Once | INTRAMUSCULAR | Status: AC
Start: 2018-05-01 — End: 2018-05-01
  Administered 2018-05-01: 20 mg via INTRAVENOUS
  Filled 2018-05-01: qty 2

## 2018-05-01 MED ORDER — DEXAMETHASONE SODIUM PHOSPHATE 10 MG/ML IJ SOLN
INTRAMUSCULAR | Status: AC
  Administered 2018-05-01: 6 mg
  Filled 2018-05-01: qty 1

## 2018-05-01 MED ORDER — BUSPIRONE HCL 15 MG PO TABS
15.0000 mg | ORAL_TABLET | Freq: Every day | ORAL | Status: DC
  Administered 2018-05-01 – 2018-05-02 (×2): 15 mg
  Filled 2018-05-01 (×2): qty 1

## 2018-05-01 MED ORDER — SENNOSIDES-DOCUSATE SODIUM 8.6-50 MG PO TABS: 1.0000 | ORAL_TABLET | Freq: Two times a day (BID) | ORAL | Status: DC | PRN

## 2018-05-01 NOTE — Progress Notes (Signed)
DAILY PROGRESS NOTE   Patient Name: Dawn Howe Date of Encounter: 05/01/2018  Chief Complaint   Agitated overnight  Patient Profile   Dawn Howe is a 72 y.o. female with a hx of COPD with chronic respiratory failure on home oxygen, lung cancer status post radiation and chemo, hypertension, hyperlipidemia who is being seen today for the evaluation of CHF at the request of Dr. Nelda Marseille  Subjective   Noted to be agitated overnight with sedation related hypotension. Echo performed today and personally reviewed, LVEF 45-50% with global hypokinesis, grade 1 DD, moderate LAE and small pericardial effusion - possible mass compressing the RA - correlate with chest CT. Bubble study was negative.  Objective   Vitals:   05/01/18 0900 05/01/18 1000 05/01/18 1100 05/01/18 1200  BP: 122/72 114/69 113/70 117/72  Pulse: 70 81 86 83  Resp: (!) '24 17 15 '$ (!) 24  Temp: 97.9 F (36.6 C) 97.7 F (36.5 C) 98.1 F (36.7 C) 98.2 F (36.8 C)  TempSrc:      SpO2: 99% 96% 99% 100%  Weight:      Height:        Intake/Output Summary (Last 24 hours) at 05/01/2018 1302 Last data filed at 05/01/2018 1200 Gross per 24 hour  Intake 1963.96 ml  Output 1250 ml  Net 713.96 ml   Filed Weights   04/29/18 0455 04/30/18 0411 05/01/18 0359  Weight: 186 lb 11.7 oz (84.7 kg) 184 lb 11.9 oz (83.8 kg) 185 lb 3 oz (84 kg)    Physical Exam   General appearance: intubated, sedated on vent Neck: no carotid bruit, no JVD and thyroid not enlarged, symmetric, no tenderness/mass/nodules Lungs: diminished breath sounds bilaterally Heart: regular rate and rhythm Abdomen: obese, soft Extremities: edema 1+ edema Pulses: 2+ and symmetric Skin: Skin color, texture, turgor normal. No rashes or lesions Neurologic: Mental status: intubated, sedated on vent Psych: Agitated overnight  Inpatient Medications    Scheduled Meds: . busPIRone  15 mg Per Tube Daily  . chlorhexidine gluconate (MEDLINE KIT)  15 mL  Mouth Rinse BID  . dexamethasone  6 mg Intravenous Q6H  . enoxaparin (LOVENOX) injection  40 mg Subcutaneous Q24H  . etomidate  20 mg Intravenous Once  . feeding supplement (PRO-STAT SUGAR FREE 64)  30 mL Per Tube Daily  . feeding supplement (VITAL HIGH PROTEIN)  1,000 mL Per Tube Q24H  . fentaNYL (SUBLIMAZE) injection  50 mcg Intravenous Once  . insulin aspart  0-15 Units Subcutaneous Q4H  . ipratropium-albuterol  3 mL Nebulization Q6H  . mouth rinse  15 mL Mouth Rinse 10 times per day  . mirtazapine  30 mg Per Tube QHS  . PARoxetine  50 mg Per Tube Daily  . QUEtiapine  100 mg Per Tube BID  . succinylcholine  100 mg Intravenous Once    Continuous Infusions: . sodium chloride Stopped (04/30/18 1616)  . cefTAZidime (FORTAZ)  IV 1 g (05/01/18 1159)  . dexmedetomidine (PRECEDEX) IV infusion 1 mcg/kg/hr (05/01/18 1213)  . famotidine (PEPCID) IV Stopped (05/01/18 1119)  . fentaNYL infusion INTRAVENOUS 50 mcg/hr (05/01/18 0906)    PRN Meds: sodium chloride, acetaminophen (TYLENOL) oral liquid 160 mg/5 mL, albuterol, fentaNYL, midazolam, midazolam, polyethylene glycol, senna-docusate   Labs   Results for orders placed or performed during the hospital encounter of 04/10/2018 (from the past 48 hour(s))  Glucose, capillary     Status: Abnormal   Collection Time: 04/29/18  4:34 PM  Result Value Ref Range   Glucose-Capillary  190 (H) 70 - 99 mg/dL  Magnesium     Status: None   Collection Time: 04/29/18  5:05 PM  Result Value Ref Range   Magnesium 1.8 1.7 - 2.4 mg/dL    Comment: Performed at [facility], [place] [address], [place], [place] [address]  Phosphorus     Status: Abnormal   Collection Time: 04/29/18  5:05 PM  Result Value Ref Range   Phosphorus 1.9 (L) 2.5 - 4.6 mg/dL    Comment: Performed at [facility] [address] [place], [place] [address]  Glucose, capillary     Status: Abnormal   Collection Time: 04/29/18  7:54 PM  Result Value Ref Range    Glucose-Capillary 275 (H) 70 - 99 mg/dL  Glucose, capillary     Status: Abnormal   Collection Time: 04/29/18  9:15 PM  Result Value Ref Range   Glucose-Capillary 204 (H) 70 - 99 mg/dL  Glucose, capillary     Status: Abnormal   Collection Time: 04/30/18 12:10 AM  Result Value Ref Range   Glucose-Capillary 212 (H) 70 - 99 mg/dL  Glucose, capillary     Status: Abnormal   Collection Time: 04/30/18  4:29 AM  Result Value Ref Range   Glucose-Capillary 184 (H) 70 - 99 mg/dL  CBC     Status: Abnormal   Collection Time: 04/30/18  4:44 AM  Result Value Ref Range   WBC 14.5 (H) 4.0 - 10.5 K/uL   RBC 3.74 (L) 3.87 - 5.11 MIL/uL   Hemoglobin 10.6 (L) 12.0 - 15.0 g/dL   HCT 34.9 (L) 36.0 - 46.0 %   MCV 93.3 78.0 - 100.0 fL   MCH 28.3 26.0 - 34.0 pg   MCHC 30.4 30.0 - 36.0 g/dL   RDW 13.4 11.5 - 15.5 %   Platelets 241 150 - 400 K/uL    Comment: Performed at [facility], [place]. [address], [place], [place] [address]  Magnesium     Status: None   Collection Time: 04/30/18  4:44 AM  Result Value Ref Range   Magnesium 1.9 1.7 - 2.4 mg/dL    Comment: Performed at [facility] [address], [place], [place] [address]  Phosphorus     Status: Abnormal   Collection Time: 04/30/18  4:44 AM  Result Value Ref Range   Phosphorus 2.1 (L) 2.5 - 4.6 mg/dL    Comment: Performed at [facility] [address] [place], [place] [address]  Glucose, capillary     Status: Abnormal   Collection Time: 04/30/18  8:07 AM  Result Value Ref Range   Glucose-Capillary 182 (H) 70 - 99 mg/dL  Glucose, capillary     Status: Abnormal   Collection Time: 04/30/18 11:15 AM  Result Value Ref Range   Glucose-Capillary 190 (H) 70 - 99 mg/dL  Glucose, capillary     Status: Abnormal   Collection Time: 04/30/18  4:18 PM  Result Value Ref Range   Glucose-Capillary 130 (H) 70 - 99 mg/dL  Magnesium     Status: None   Collection Time: 04/30/18  5:02 PM  Result Value Ref Range   Magnesium 2.0 1.7 - 2.4 mg/dL     Comment: Performed at [facility], [place] [address], [place], [place] [address]  Phosphorus     Status: Abnormal   Collection Time: 04/30/18  5:02 PM  Result Value Ref Range   Phosphorus 2.4 (L) 2.5 - 4.6 mg/dL  Comment: Performed at [facility], [place] [address] [place], [place] [address]  Glucose, capillary     Status: Abnormal   Collection Time: 04/30/18  9:32 PM  Result Value Ref Range   Glucose-Capillary 150 (H) 70 - 99 mg/dL   Comment 1 Notify RN   Glucose, capillary     Status: Abnormal   Collection Time: 04/30/18 11:48 PM  Result Value Ref Range   Glucose-Capillary 226 (H) 70 - 99 mg/dL  Glucose, capillary     Status: Abnormal   Collection Time: 05/01/18  1:48 AM  Result Value Ref Range   Glucose-Capillary 145 (H) 70 - 99 mg/dL  I-STAT 3, arterial blood gas (G3+)     Status: Abnormal   Collection Time: 05/01/18  2:35 AM  Result Value Ref Range   pH, Arterial 7.293 (L) 7.350 - 7.450   pCO2 arterial 56.6 (H) 32.0 - 48.0 mmHg   pO2, Arterial 95.0 83.0 - 108.0 mmHg   Bicarbonate 27.4 20.0 - 28.0 mmol/L   TCO2 29 22 - 32 mmol/L   O2 Saturation 96.0 %   Patient temperature HIDE    Collection site RADIAL, ALLEN'S TEST ACCEPTABLE    Drawn by RT    Sample type ARTERIAL   Glucose, capillary     Status: Abnormal   Collection Time: 05/01/18  3:58 AM  Result Value Ref Range   Glucose-Capillary 133 (H) 70 - 99 mg/dL   Comment 1 Notify RN   CBC     Status: Abnormal   Collection Time: 05/01/18  5:00 AM  Result Value Ref Range   WBC 11.2 (H) 4.0 - 10.5 K/uL   RBC 3.61 (L) 3.87 - 5.11 MIL/uL   Hemoglobin 10.1 (L) 12.0 - 15.0 g/dL   HCT 34.0 (L) 36.0 - 46.0 %   MCV 94.2 78.0 - 100.0 fL   MCH 28.0 26.0 - 34.0 pg   MCHC 29.7 (L) 30.0 - 36.0 g/dL   RDW 13.5 11.5 - 15.5 %   Platelets 208 150 - 400 K/uL    Comment: Performed at [facility], [address] [place], [place] [address]  Basic metabolic panel     Status: Abnormal   Collection Time: 05/01/18   5:00 AM  Result Value Ref Range   Sodium 143 135 - 145 mmol/L   Potassium 4.5 3.5 - 5.1 mmol/L   Chloride 110 98 - 111 mmol/L    Comment: Please note change in reference range.   CO2 27 22 - 32 mmol/L   Glucose, Bld 159 (H) 70 - 99 mg/dL    Comment: Please note change in reference range.   BUN 28 (H) 8 - 23 mg/dL    Comment: Please note change in reference range.   Creatinine, Ser 0.72 0.44 - 1.00 mg/dL   Calcium 8.4 (L) 8.9 - 10.3 mg/dL   GFR calc non Af Amer >60 >60 mL/min   GFR calc Af Amer >60 >60 mL/min    Comment: (NOTE) The eGFR has been calculated using the CKD EPI equation. This calculation has not been validated in all clinical situations. eGFR's persistently <60 mL/min signify possible Chronic Kidney Disease.    Anion gap 6 5 - 15    Comment: Performed at [facility] [address] [place] [address], [place], [place] [address]  Glucose, capillary     Status: Abnormal   Collection Time: 05/01/18  6:11 AM  Result Value Ref Range   Glucose-Capillary 127 (H) 70 -  99 mg/dL   Comment 1 Notify RN   Glucose, capillary     Status: Abnormal   Collection Time: 05/01/18  7:51 AM  Result Value Ref Range   Glucose-Capillary 167 (H) 70 - 99 mg/dL  I-STAT 3, arterial blood gas (G3+)     Status: Abnormal   Collection Time: 05/01/18  9:10 AM  Result Value Ref Range   pH, Arterial 7.353 7.350 - 7.450   pCO2 arterial 49.6 (H) 32.0 - 48.0 mmHg   pO2, Arterial 117.0 (H) 83.0 - 108.0 mmHg   Bicarbonate 27.7 20.0 - 28.0 mmol/L   TCO2 29 22 - 32 mmol/L   O2 Saturation 98.0 %   Acid-Base Excess 2.0 0.0 - 2.0 mmol/L   Patient temperature 36.6 C    Collection site RADIAL, ALLEN'S TEST ACCEPTABLE    Drawn by VP    Sample type ARTERIAL   Glucose, capillary     Status: Abnormal   Collection Time: 05/01/18 12:08 PM  Result Value Ref Range   Glucose-Capillary 225 (H) 70 - 99 mg/dL    ECG   N/A  Telemetry   Sinus rhythm - Personally Reviewed  Radiology    Dg Chest Port 1 View  Result  Date: 05/01/2018 CLINICAL DATA:  72 year old female status post intubation. EXAM: PORTABLE CHEST 1 VIEW COMPARISON:  Chest radiograph dated 04/27/2018 FINDINGS: An endotracheal tube the tip approximately 4 cm above the carina and an enteric tube with tip in the left upper abdomen likely in the gastric fundus noted. Left IJ central venous line with tip over SVC at the level of the innominate vein. No interval change in the appearance of the lungs since the prior radiograph. Stable cardiac silhouette. Atherosclerotic calcification of the aorta. IMPRESSION: 1. Endotracheal tube above the carina and enteric tube in the gastric fundus. 2. Emphysema with interstitial prominence and nodularity and small left pleural effusion similar to prior radiograph. Electronically Signed   By: Anner Crete M.D.   On: 05/01/2018 01:51    Cardiac Studies   LV EF: 45% -   50%  ------------------------------------------------------------------- Indications:      Patent foramen ovale 745.5.  Respiratory Failure acute 518.81.  ------------------------------------------------------------------- History:   PMH:   Congestive heart failure.  PMH:   Myocardial infarction.  ------------------------------------------------------------------- Study Conclusions  - Left ventricle: The cavity size was normal. Wall thickness was   normal. Systolic function was mildly reduced. The estimated   ejection fraction was in the range of 45% to 50%. Diffuse   hypokinesis. Doppler parameters are consistent with abnormal left   ventricular relaxation (grade 1 diastolic dysfunction). - Mitral valve: There was mild regurgitation. - Left atrium: The atrium was moderately dilated. - Pulmonary arteries: PA peak pressure: 36 mm Hg (S). - Pericardium, extracardiac: A small pericardial effusion was   identified.  Recommendations:  Mild global reduction in LV systolic function; mild diastolic dysfunction; mild MR; moderate LAE; cannot  R/O mass compressing right atrium on subcostal images; suggest cardiac CTA to further assess; small pericardial effusion. Negative saline microcavitation study.  Assessment   1. Active Problems: 2.   Acute respiratory failure with hypoxemia (HCC) 3.   SEMI (subendocardial myocardial infarction) ([place]) 4.   Acute systolic congestive heart failure ([place]) 5.   Plan   1. LVEF appears globally depressed, but mildly on echo at 45-50% - troponin elevation is likely related to subendocardial ischemia without regional wall motion abnormalities. Bubble study was negative for any type of right to left shunting.  Possible mass compressing the RA on echo - we have recommended chest CT to further evaluate if not known. Recommend continued medical therapy for now. May need additional diuresis as bp allows.  Time Spent Directly with Patient:  I have spent a total of 25 minutes with the patient reviewing hospital notes, telemetry, EKGs, labs and examining the patient as well as establishing an assessment and plan that was discussed personally with the patient.  > 50% of time was spent in direct patient care.  Length of Stay:  LOS: 3 days   Pixie Casino, MD, [facility], [place] Director of the Advanced Lipid Disorders &  Cardiovascular Risk Reduction Clinic Diplomate of the American Board of Clinical Lipidology Attending Cardiologist  Direct Dial: [phone]  Fax: [phone]  Website:  [email or web address] Howe 05/01/2018, 1:02 PM

## 2018-05-01 NOTE — Progress Notes (Signed)
Echocardiogram 2D Echocardiogram with bubble study has been performed.  Joelene Millin 05/01/2018, 8:59 AM

## 2018-05-01 NOTE — Progress Notes (Signed)
Arrived to pt room to note ett has been removed, precedex drip and tube feedings stopped, nursing and RT at bedside and Dr Deterding assessing by camera visual. RT placed pt on NRB, pt placed high fowlers position. Pt with labored rr audible rhonchi absent cough lethargic and following no commands. hr 140s, sbp200 by aline. Dr Jimmey Ralph to bedside.

## 2018-05-01 NOTE — Progress Notes (Signed)
Follow up - Critical Care Medicine Note  Patient Details:    Dawn Howe is an 72 y.o. female.  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 Traveling from [place] and intubated at  [facility] for hypoxic respiratory failure.   Lines, Airways, Drains: Airway 7.5 mm (Active)  Secured at (cm) 25 cm 04/29/2018 11:18 AM  Measured From Lips 04/29/2018 12:00 PM  [place] 04/29/2018 11:18 AM  Secured By [employer] 04/29/2018 12:00 PM  Tube Holder Repositioned Yes 04/29/2018 11:18 AM  Cuff Pressure (cm H2O) 28 cm H2O 04/29/2018  8:03 AM  Site Condition Dry 04/29/2018 12:00 PM     CVC Triple Lumen 04/27/2018 Left Internal jugular (Active)  Indication for Insertion or Continuance of Line Vasoactive infusions 04/29/2018  8:00 AM  Site Assessment Clean;Dry;Intact 04/29/2018  8:00 AM  Proximal Lumen Status Infusing 04/29/2018  8:00 AM  Medial Lumen Status Infusing 04/29/2018  8:00 AM  Distal Lumen Status In-line blood sampling system in place 04/29/2018  8:00 AM  Dressing Type Occlusive 04/29/2018  8:00 AM  Dressing Status Antimicrobial disc in place;Clean;Dry;Intact 04/29/2018  8:00 AM  Line Care Connections checked and tightened 04/29/2018  8:00 AM  Dressing Intervention Other (Comment) 04/29/2018  8:00 AM  Dressing Change Due 05/06/18 04/29/2018  8:00 AM     NG/OG Tube Orogastric Center mouth (Active)  Site Assessment Clean;Dry;Intact 04/29/2018  8:00 AM  Ongoing Placement Verification No change in cm markings or external length of tube from initial placement;No change in respiratory status;No acute changes, not attributed to clinical condition 04/29/2018  8:00 AM  Status Infusing tube feed 04/29/2018 12:00 PM  Drainage Appearance Bile 04/29/2018  3:12 AM  Intake (mL) 0 mL 04/29/2018  3:12 AM  Output (mL) 100 mL 04/29/2018  6:00 AM     Urethral Catheter day RN (Active)  Indication for Insertion or  Continuance of Catheter Unstable critical patients (first 24-48 hours) 04/29/2018  8:00 AM  Site Assessment Clean;Intact 04/29/2018  8:00 AM  Catheter Maintenance Bag below level of bladder;Catheter secured;Drainage bag/tubing not touching floor;Insertion date on drainage bag;No dependent loops;Seal intact 04/29/2018  8:00 AM  Collection Container Standard drainage bag 04/29/2018  8:00 AM  Securement Method Leg strap 04/29/2018  8:00 AM  Urinary Catheter Interventions Unclamped 04/29/2018  8:00 AM  Output (mL) 100 mL 04/29/2018  2:00 PM    Anti-infectives:  Anti-infectives (From admission, onward)   Start     Dose/Rate Route Frequency Ordered Stop   04/30/18 1100  cefTAZidime (FORTAZ) 1 g in sodium chloride 0.9 % 100 mL IVPB     1 g 200 mL/hr over 30 Minutes Intravenous Every 8 hours 04/30/18 0950     04/10/2018 1630  azithromycin (ZITHROMAX) 500 mg in sodium chloride 0.9 % 250 mL IVPB  Status:  Discontinued     500 mg 250 mL/hr over 60 Minutes Intravenous Every 24 hours 04/27/2018 1627 04/30/18 0950   04/20/2018 1630  cefTRIAXone (ROCEPHIN) 2 g in sodium chloride 0.9 % 100 mL IVPB  Status:  Discontinued     2 g 200 mL/hr over 30 Minutes Intravenous Every 24 hours 04/11/2018 1627 04/30/18 0950      Microbiology: Results for orders placed or performed during the hospital encounter of 04/15/2018  Culture, respiratory (tracheal aspirate)     Status: None   Collection Time: 04/18/2018  4:45 PM  Result Value Ref Range Status   Specimen Description TRACHEAL  ASPIRATE  Final   Special Requests NONE  Final   Gram Stain   Final    ABUNDANT WBC PRESENT, PREDOMINANTLY PMN FEW GRAM NEGATIVE RODS Performed at [facility], [place] [address], [place], [place] [address]    Culture MODERATE PSEUDOMONAS AERUGINOSA  Final   Report Status 04/30/2018 FINAL  Final   Organism ID, Bacteria PSEUDOMONAS AERUGINOSA  Final      Susceptibility   Pseudomonas aeruginosa - MIC*    CEFTAZIDIME 4 SENSITIVE Sensitive      CIPROFLOXACIN <=0.25 SENSITIVE Sensitive     GENTAMICIN <=1 SENSITIVE Sensitive     IMIPENEM 1 SENSITIVE Sensitive     PIP/TAZO 8 SENSITIVE Sensitive     CEFEPIME 2 SENSITIVE Sensitive     * MODERATE PSEUDOMONAS AERUGINOSA  MRSA PCR Screening     Status: None   Collection Time: 04/10/2018  4:45 PM  Result Value Ref Range Status   MRSA by PCR NEGATIVE NEGATIVE Final    Comment:        The GeneXpert MRSA Assay (FDA approved for NASAL specimens only), is one component of a comprehensive MRSA colonization surveillance program. It is not intended to diagnose MRSA infection nor to guide or monitor treatment for MRSA infections. Performed at [facility], [place] [address], [place], [place] [address]   Culture, blood (routine x 2)     Status: None (Preliminary result)   Collection Time: 04/15/2018  5:20 PM  Result Value Ref Range Status   Specimen Description BLOOD RIGHT ANTECUBITAL  Final   Special Requests   Final    BOTTLES DRAWN AEROBIC ONLY Blood Culture adequate volume   Culture   Final    NO GROWTH 2 DAYS Performed at [facility], [place] [address], [place], [place] [address]    Report Status PENDING  Incomplete  Culture, blood (routine x 2)     Status: None (Preliminary result)   Collection Time: 04/07/2018  5:25 PM  Result Value Ref Range Status   Specimen Description BLOOD LEFT ANTECUBITAL  Final   Special Requests   Final    BOTTLES DRAWN AEROBIC ONLY Blood Culture adequate volume   Culture   Final    NO GROWTH 2 DAYS Performed at [facility], [place] [address] [place], [place] [address]    Report Status PENDING  Incomplete  Urine culture     Status: None   Collection Time: 04/25/2018  5:51 PM  Result Value Ref Range Status   Specimen Description URINE, RANDOM  Final   Special Requests NONE  Final   Culture   Final    NO GROWTH Performed at [facility], [address], [place], [place] [address]    Report Status 04/29/2018 FINAL  Final   BMET     Component Value Date/Time   NA 143 05/01/2018 0500   K 4.5 05/01/2018 0500   CL 110 05/01/2018 0500   CO2 27 05/01/2018 0500   GLUCOSE 159 (H) 05/01/2018 0500   BUN 28 (H) 05/01/2018 0500   CREATININE 0.72 05/01/2018 0500   CALCIUM 8.4 (L) 05/01/2018 0500   GFRNONAA >60 05/01/2018 0500   GFRAA >60 05/01/2018 0500   CBC    Component Value Date/Time   WBC 11.2 (H) 05/01/2018 0500   RBC 3.61 (L) 05/01/2018 0500   HGB 10.1 (L) 05/01/2018 0500   HCT 34.0 (L) 05/01/2018 0500   PLT 208 05/01/2018 0500   MCV 94.2 05/01/2018  0500   MCH 28.0 05/01/2018 0500   MCHC 29.7 (L) 05/01/2018 0500   RDW 13.5 05/01/2018 0500   LYMPHSABS 3.0 04/13/2018 1754   MONOABS 0.8 04/17/2018 1754   EOSABS 0.1 04/30/2018 1754   BASOSABS 0.1 04/02/2018 1754   ABG    Component Value Date/Time   PHART 7.353 05/01/2018 0910   PCO2ART 49.6 (H) 05/01/2018 0910   PO2ART 117.0 (H) 05/01/2018 0910   HCO3 27.7 05/01/2018 0910   TCO2 29 05/01/2018 0910   ACIDBASEDEF 8.0 (H) 04/06/2018 1719   O2SAT 98.0 05/01/2018 0910    Best Practice/Protocols:  VTE Prophylaxis: Lovenox (prophylaxtic dose) ARDS  Events: Intubated 04/19/2018 Troponin elevated - echo shows EF 20% with global hypokinesis. Cardiology consulted. Dexmedetomidine added, patient less agitated and better synchronized with  ventilator Self-extubation 6/30 - re-intubated for stridor.    Studies: Dg Chest Port 1 View  Result Date: 05/01/2018 CLINICAL DATA:  72 year old female status post intubation. EXAM: PORTABLE CHEST 1 VIEW COMPARISON:  Chest radiograph dated 04/05/2018 FINDINGS: An endotracheal tube the tip approximately 4 cm above the carina and an enteric tube with tip in the left upper abdomen likely in the gastric fundus noted. Left IJ central venous line with tip over SVC at the level of the innominate vein. No interval change in the appearance of the lungs since the prior radiograph. Stable cardiac silhouette. Atherosclerotic  calcification of the aorta. IMPRESSION: 1. Endotracheal tube above the carina and enteric tube in the gastric fundus. 2. Emphysema with interstitial prominence and nodularity and small left pleural effusion similar to prior radiograph. Electronically Signed   By: Anner Crete M.D.   On: 05/01/2018 01:51   Consults: Treatment Team:  Lbcardiology, Michae Kava, MD   Subjective:    Overnight Issues: Self-extubated overnight. Marked stridor. Re-intubated and started on dexamethasone.  No BM in several days.  Objective:  Vital signs for last 24 hours: Temp:  [97.5 F (36.4 C)-101.5 F (38.6 C)] 97.9 F (36.6 C) (06/30 0900) Pulse Rate:  [70-143] 70 (06/30 0900) Resp:  [11-34] 24 (06/30 0900) BP: (91-167)/(51-130) 122/72 (06/30 0900) SpO2:  [93 %-100 %] 99 % (06/30 0900) Arterial Line BP: (70-239)/(44-106) 125/66 (06/30 0905) FiO2 (%):  [40 %] 40 % (06/30 0910) Weight:  [185 lb 3 oz (84 kg)] 185 lb 3 oz (84 kg) (06/30 0359)  Hemodynamic parameters for last 24 hours: CVP:  [8 mmHg-39 mmHg] 8 mmHg  Intake/Output from previous day: 06/29 0701 - 06/30 0700 In: 1683.4 [I.V.:724.4; NG/GT:265; IV Piggyback:694] Out: 1150 [Urine:1150]  Intake/Output this shift: Total I/O In: 505.9 [I.V.:82.4; NG/GT:373.5; IV Piggyback:50] Out: 200 [Urine:200]  Vent settings for last 24 hours: Vent Mode: PRVC FiO2 (%):  [40 %] 40 % Set Rate:  [20 bmp-24 bmp] 24 bmp Vt Set:  [430 mL] 430 mL PEEP:  [5 cmH20] 5 cmH20 Pressure Support:  [10 cmH20] 10 cmH20 Plateau Pressure:  [22 cmH20-26 cmH20] 25 cmH20  Physical Exam:  General: appears older than stated age.  Neuro: responds to voice and able to follow simple commands. Moves all limbs with generalized weakness. Inattention +. HEENT/Neck: endotracheal tube in place. Resp: PRVC ventilation. Periodic ventilator dyssynchrony. Taking appropriate Vt on PSV 10 but periods of apnea. Vesicular breathing throughout with wheezing.  CVS: JVP not elevated. S1,S2  normal. Extremities warm. GI: soft,not tender. Skin: line sites clean. Extremities: no edema.  Bedside echo performed by myself: improved LVSF.  Normal RV function. Euvolemic.  Assessment/Plan:   NEURO  Agitated delirium.  Plan: Wean fentanyl and continue dexmedetomidine to improve respiratory drive. Increase enteral Seroquel and add back home Paxil, Remeron, and Buspar.  PULM  Acute hypoxic respiratory failure due to Pseudomonal pneumonia on background of COPD and prior lung cancer. Signs of improvement and appears to have failed extubation largely due to stridor   Plan: Continue lung protective ventilation. Daily SBT.  Can re-attempt extubation following 48h of Decadron. Steroids may also treat component of decompensated COPD.  CARDIO  Stress cardiomyopathy. Degree of troponin elevation out of proportion degree of LV dysfunction. Sedation related hypotension.    Plan: stop ACS heparin.  Repeat echocardiogram pending.  RENAL  Normal renal function. Negative fluid balance   Plan: Keep even to negative balance.  GI  Moderate nutritional risk.  No BM   Plan: Continue enteral nutrition. Bowel regimen   ID  Pseudomonal pneumonia.   Plan: Tailor antibiotics to sputum isolate. Start ceftazidime.  HEME  Leukocytosis improving.  High DVT risk.   Plan: Lovenox for DVT prophylaxis.  ENDO  type 2 diabetes with stress hyperglycemia.  Improving glycemic control.   Plan: Continue current insulin regimen.  Global Issues   Overall improvement but prognosis remains guarded as status of underlying cancer is unknown.   LOS: 3 days   Additional comments: Husband updated at bedside today.  Critical Care Total Time*: 40  Dawn Howe 05/01/2018  *Care during the described time interval was provided by me and/or other providers on the critical care team.  I have reviewed this patient's available data, including medical history, events of note, physical examination and test results as part of  my evaluation.

## 2018-05-01 NOTE — Progress Notes (Signed)
Patient self-extubated. On initial eval she is awake and obeying commands, hoarse voice, loud stridor and some rhonchi. NT suctioned with resolution of rhonchi. Gave 1 racemic epi neb with only mild improvement of stridor; will give 2nd neb now. Pox stable on 5L O2. Will obtain ABG and monitor closely. Low threshold to reintubate.   Vernie Murders, MD Pulmonary & Critical Care Medicine Pager: [phone]

## 2018-05-01 NOTE — Progress Notes (Signed)
RT called to patient bedside due to patient self extubating. ETT pulled and placed patient on 5LNC. Patient able to maintain sats but had rhonci and stridor. Patient given 2 of racemic nebs with little improvement. RT NTS patient removing a moderate amount of tan thick secretions. ICU MD Hammonds at bedside and decided to reintubate. Patient stable at this time.

## 2018-05-01 NOTE — Progress Notes (Signed)
ABG post intubation with respiratory acidosis. Increase RR from 20 to 24. Repeat ABG at 7am.

## 2018-05-01 NOTE — Procedures (Signed)
Endotracheal Intubation Procedure Note Indication for endotracheal intubation: airway compromise and impending respiratory failure Sedation: etomidate and midazolam Paralytic: succinylcholine Equipment: [employer]osh 3 laryngoscope blade and 7.11mm cuffed endotracheal tube; secured 25cm at the gum Cricoid Pressure: yes Number of attempts: 1 ETT location confirmed by by auscultation, by CXR and ETCO2 monitor.   Very shortly after the last note patient became less responsive, unable to vocalize "yes" or "no" to whether she felt SOB. Decision to reintubate. Tolerated procedure well without complication. Will add add steroids given the stridor.

## 2018-05-02 ENCOUNTER — Encounter (HOSPITAL_COMMUNITY): Payer: Self-pay | Admitting: Emergency Medicine

## 2018-05-02 LAB — BASIC METABOLIC PANEL
Anion gap: 12 (ref 5–15)
BUN: 29 mg/dL — AB (ref 8–23)
CHLORIDE: 103 mmol/L (ref 98–111)
CO2: 29 mmol/L (ref 22–32)
CREATININE: 0.75 mg/dL (ref 0.44–1.00)
Calcium: 9.1 mg/dL (ref 8.9–10.3)
GFR calc Af Amer: >60 mL/min (ref >60)
GFR calc non Af Amer: >60 mL/min (ref >60)
Glucose, Bld: 193 mg/dL — ABNORMAL HIGH (ref 70–99)
Potassium: 4 mmol/L (ref 3.5–5.1)
SODIUM: 144 mmol/L (ref 135–145)

## 2018-05-02 LAB — POCT I-STAT 3, ART BLOOD GAS (G3+)
Acid-Base Excess: 7 mmol/L — ABNORMAL HIGH (ref 0.0–2.0)
BICARBONATE: 33 mmol/L — AB (ref 20.0–28.0)
O2 SAT: 98 %
PCO2 ART: 54.1 mmHg — AB (ref 32.0–48.0)
PO2 ART: 114 mmHg — AB (ref 83.0–108.0)
TCO2: 35 mmol/L — ABNORMAL HIGH (ref 22–32)
pH, Arterial: 7.394 (ref 7.350–7.450)

## 2018-05-02 LAB — GLUCOSE, CAPILLARY
GLUCOSE-CAPILLARY: 189 mg/dL — AB (ref 70–99)
GLUCOSE-CAPILLARY: 185 mg/dL — AB (ref 70–99)
Glucose-Capillary: 198 mg/dL — ABNORMAL HIGH (ref 70–99)
Glucose-Capillary: 208 mg/dL — ABNORMAL HIGH (ref 70–99)
Glucose-Capillary: 252 mg/dL — ABNORMAL HIGH (ref 70–99)
Glucose-Capillary: 223 mg/dL — ABNORMAL HIGH (ref 70–99)

## 2018-05-02 LAB — CBC
HEMATOCRIT: 34.2 % — AB (ref 36.0–46.0)
HEMOGLOBIN: 10.3 g/dL — AB (ref 12.0–15.0)
MCH: 28.4 pg (ref 26.0–34.0)
MCHC: 30.1 g/dL (ref 30.0–36.0)
MCV: 94.2 fL (ref 78.0–100.0)
Platelets: 231 10*3/uL (ref 150–400)
RBC: 3.63 MIL/uL — ABNORMAL LOW (ref 3.87–5.11)
RDW: 13.3 % (ref 11.5–15.5)
WBC: 11.2 10*3/uL — ABNORMAL HIGH (ref 4.0–10.5)

## 2018-05-02 MED ORDER — PANTOPRAZOLE SODIUM 40 MG PO PACK
40.0000 mg | PACK | ORAL | Status: DC
  Administered 2018-05-02: 40 mg
  Filled 2018-05-02: qty 20

## 2018-05-02 MED ORDER — ATORVASTATIN CALCIUM 20 MG PO TABS: 20.0000 mg | ORAL_TABLET | Freq: Every day | ORAL | Status: DC

## 2018-05-02 MED ORDER — ATORVASTATIN CALCIUM 20 MG PO TABS
20.0000 mg | ORAL_TABLET | Freq: Every day | ORAL | Status: DC
  Administered 2018-05-02: 20 mg
  Filled 2018-05-02: qty 1

## 2018-05-02 NOTE — Progress Notes (Signed)
   Progress Note  Patient Name: Dawn Howe Date of Encounter: 05/02/2018  Primary Cardiologist: New- Dr. Nishan, MD   Subjective   Pt currently intubated and sedated. Husband at bedside.   Inpatient Medications    Scheduled Meds: . atorvastatin  20 mg Per Tube q1800  . busPIRone  15 mg Per Tube Daily  . chlorhexidine gluconate (MEDLINE KIT)  15 mL Mouth Rinse BID  . dexamethasone  6 mg Intravenous Q6H  . enoxaparin (LOVENOX) injection  40 mg Subcutaneous Q24H  . feeding supplement (PRO-STAT SUGAR FREE 64)  30 mL Per Tube Daily  . feeding supplement (VITAL HIGH PROTEIN)  1,000 mL Per Tube Q24H  . insulin aspart  0-15 Units Subcutaneous Q4H  . ipratropium-albuterol  3 mL Nebulization Q6H  . mouth rinse  15 mL Mouth Rinse 10 times per day  . mirtazapine  30 mg Per Tube QHS  . pantoprazole sodium  40 mg Per Tube Q24H  . PARoxetine  50 mg Per Tube Daily  . QUEtiapine  100 mg Per Tube BID   Continuous Infusions: . sodium chloride Stopped (04/30/18 1616)  . cefTAZidime (FORTAZ)  IV Stopped (05/02/18 0445)  . dexmedetomidine (PRECEDEX) IV infusion 1 mcg/kg/hr (05/02/18 0602)  . fentaNYL infusion INTRAVENOUS 100 mcg/hr (05/02/18 0611)   PRN Meds: sodium chloride, acetaminophen (TYLENOL) oral liquid 160 mg/5 mL, albuterol, fentaNYL, midazolam, polyethylene glycol, senna-docusate   Vital Signs    Vitals:   05/02/18 0821 05/02/18 0900 05/02/18 0942 05/02/18 1000  BP:  (!) 150/77  126/68  Pulse:  (!) 121  (!) 116  Resp:  20  20  Temp:  100.2 F (37.9 C)  (!) 100.8 F (38.2 C)  TempSrc:      SpO2: 100% 96% 96% 96%  Weight:      Height:        Intake/Output Summary (Last 24 hours) at 05/02/2018 1102 Last data filed at 05/02/2018 0633 Gross per 24 hour  Intake 1467.89 ml  Output 2550 ml  Net -1082.11 ml   Filed Weights   04/30/18 0411 05/01/18 0359 05/02/18 0500  Weight: 184 lb 11.9 oz (83.8 kg) 185 lb 3 oz (84 kg) 182 lb 5.1 oz (82.7 kg)    Physical Exam    General: Well developed, well nourished, NAD Skin: Warm, dry, intact  Head: Normocephalic, atraumatic, clear, moist mucus membranes. Neck: Negative for carotid bruits. No JVD Lungs: Bilateral rhonchi throughout all fields>>L>R. ETT in place  Cardiovascular: RRR with S1 S2. No murmurs, rubs or gallops Abdomen: Firm, non-tender, non-distended with normoactive bowel sounds. No obvious abdominal masses. MSK: Strength and tone appear normal for age. 5/5 in all extremities Extremities: No edema. No clubbing or cyanosis. DP/PT pulses 1+ bilaterally Neuro: Intubated and sedated. No focal deficits. No facial asymmetry. MAE spontaneously. Psych: Intubated and sedated   Labs    Chemistry Recent Labs  Lab 05/01/2018 1754 05/01/18 0500 05/02/18 0400  NA 144 143 144  K 4.1 4.5 4.0  CL 113* 110 103  CO2 22 27 29  GLUCOSE 124* 159* 193*  BUN 11 28* 29*  CREATININE 0.90 0.72 0.75  CALCIUM 7.5* 8.4* 9.1  PROT 5.6*  --   --   ALBUMIN 3.1*  --   --   AST 88*  --   --   ALT 84*  --   --   ALKPHOS 54  --   --   BILITOT 0.3  --   --   GFRNONAA >60 >  60 >60  GFRAA >60 >60 >60  ANIONGAP 9 6 12     Hematology Recent Labs  Lab 04/30/18 0444 05/01/18 0500 05/02/18 0400  WBC 14.5* 11.2* 11.2*  RBC 3.74* 3.61* 3.63*  HGB 10.6* 10.1* 10.3*  HCT 34.9* 34.0* 34.2*  MCV 93.3 94.2 94.2  MCH 28.3 28.0 28.4  MCHC 30.4 29.7* 30.1  RDW 13.4 13.5 13.3  PLT 241 208 231    Cardiac Enzymes Recent Labs  Lab 04/17/2018 1754 04/22/2018 2206 04/29/18 0437  TROPONINI 0.99* 0.64* 0.51*   No results for input(s): TROPIPOC in the last 168 hours.   BNPNo results for input(s): BNP, PROBNP in the last 168 hours.   DDimer No results for input(s): DDIMER in the last 168 hours.   Radiology    Dg Chest Port 1 View  Result Date: 05/01/2018 CLINICAL DATA:  72-year-old female status post intubation. EXAM: PORTABLE CHEST 1 VIEW COMPARISON:  Chest radiograph dated 04/18/2018 FINDINGS: An endotracheal tube the  tip approximately 4 cm above the carina and an enteric tube with tip in the left upper abdomen likely in the gastric fundus noted. Left IJ central venous line with tip over SVC at the level of the innominate vein. No interval change in the appearance of the lungs since the prior radiograph. Stable cardiac silhouette. Atherosclerotic calcification of the aorta. IMPRESSION: 1. Endotracheal tube above the carina and enteric tube in the gastric fundus. 2. Emphysema with interstitial prominence and nodularity and small left pleural effusion similar to prior radiograph. Electronically Signed   By: Arash  Radparvar M.D.   On: 05/01/2018 01:51   Telemetry    05/02/18 ST HR 117 - Personally Reviewed  ECG    No new tracings as of 05/02/18- Personally Reviewed  Cardiac Studies   Echocardiogram 05/01/18: Study Conclusions  - Left ventricle: The cavity size was normal. Wall thickness was normal. Systolic function was mildly reduced. The estimated ejection fraction was in the range of 45% to 50%. Diffuse hypokinesis. Doppler parameters are consistent with abnormal left ventricular relaxation (grade 1 diastolic dysfunction). - Mitral valve: There was mild regurgitation. - Left atrium: The atrium was moderately dilated. - Pulmonary arteries: PA peak pressure: 36 mm Hg (S). - Pericardium, extracardiac: A small pericardial effusion was identified.  Recommendations: Mild global reduction in LV systolic function; mild diastolic dysfunction; mild MR; moderate LAE; cannot R/O mass compressing right atrium on subcostal images; suggest cardiac CTA to further assess; small pericardial effusion. Negative saline microcavitation study.  Patient Profile     72 y.o. female with a hx of COPD with chronic respiratory failure on home oxygen, lung cancer status post radiation and chemo, hypertension, hyperlipidemiawho is being followed by Cardiology for the evaluation ofCHFat the request of Dr.  Yacoub.  Assessment & Plan    1. SEMI (subendocardial MI): -Pt admitted with acute repsiratory failure, transferred from [facility] and found to have an elevated trop of 0.66 -Trop, 0.99>0.64>0.51 -Likely demand ischemia in the setting of acute respiratory failure>>possible progression of lung CA>>>recommendations have been made for continued medical therapy -Echo with LVEF of 45-50% with diffuse hypokinesis and G1DD -CT chest showed hilar mass with b/l pulmonary artery involvement and possible lymphangitic spread -Continue statin -Would consider adding low dose IV Lasix to her regimen given fluid volume overload on exam   2. Acute systolic heart failure: -Per echocardiogram with LVEF of 45% to 50% with diffuse hypokinesis and grade 1 diastolic dysfunction performed 05/01/18 -Hx of chemo/radiation for lung CA  -  Bubble study negative for R>L shunting  -BNP on admission, 544 -CT chest showed hilar mass with b/l pulmonary artery involvement and possible lymphangitic spread -Would consider adding low dose IV Lasix to her regimen given fluid volume overload on exam  -Creatinine, 0.75 today   3. Acute respiratory failure: -Intubated per PCCM  -Currently remains intubated and sedated -PEEP remains at 8 -Wean as tolerated   4. HTN: -Stabilizing, 126/68>150/77>131/69 -Not currently on antihypertensives  -Would consider adding low dose lasix now that BP is more stable   5. Tobacco use: -Nicotine patch in place   6. COPD: -Per primary team   Signed, Jill McDaniel NP-C [facility] Pager: [phone] 05/02/2018, 11:02 AM     For questions or updates, please contact   Please consult [email or web address] for contact info under Cardiology/STEMI.  

## 2018-05-02 NOTE — Progress Notes (Signed)
[facility] Progress Note Patient Name: Mitra Duling DOB: 05-27-1946 MRN: [id number]   Date of Service  05/02/2018  HPI/Events of Note  Agitation - Request to renew restraint orders.   eICU Interventions  Will renew restraint orders.      Intervention Category Minor Interventions: Agitation / anxiety - evaluation and management  Sommer,Steven Eugene 05/02/2018, 12:47 AM

## 2018-05-02 NOTE — Progress Notes (Signed)
PULMONARY / CRITICAL CARE MEDICINE   Name: Dawn Howe MRN: [id number] DOB: 12/10/1945    ADMISSION DATE:  04/17/2018  REFERRING MD:  Oval Linsey  CHIEF COMPLAINT:  Short of breath  HISTORY OF PRESENT ILLNESS:   72 yo female smoker travelling from [place] presented to [facility] hospital with dyspnea, and near syncope.  Required intubation for hypoxia/hypercapnia.  CT chest showed hilar mass with b/l pulmonary artery involvement and possible lymphangitic spread.  Echo showed systolic dysfunction.  PAST MEDICAL HISTORY :  Lung cancer s/p chemoradiation, COPD on 2 liters, CAD, CHF, HTN.  SUBJECTIVE:  Remains on vent.  VITAL SIGNS: BP 131/69   Pulse 95   Temp 99.5 F (37.5 C)   Resp (!) 24   Ht 5\' 4"  (1.626 m)   Wt 182 lb 5.1 oz (82.7 kg)   SpO2 100%   BMI 31.30 kg/m   HEMODYNAMICS: CVP:  [6 mmHg-39 mmHg] 6 mmHg  VENTILATOR SETTINGS: Vent Mode: PRVC FiO2 (%):  [40 %] 40 % Set Rate:  [24 bmp] 24 bmp Vt Set:  [430 mL] 430 mL PEEP:  [5 cmH20] 5 cmH20 Plateau Pressure:  [20 cmH20-27 cmH20] 24 cmH20  INTAKE / OUTPUT: I/O last 3 completed shifts: In: 2901.2 [I.V.:1165; NG/GT:1453.5; IV Piggyback:282.7] Out: 3450 [Urine:2900; Emesis/NG output:550]  PHYSICAL EXAMINATION:  General - sedated Eyes - pupils reactive ENT - ETT in place Cardiac - irregular, no murmur Chest - decreased BS, no wheeze, rales Abd - soft, non tender Ext - no edema Skin - no rashes Neuro - RASS -1  LABS:  BMET Recent Labs  Lab 04/07/2018 1754 05/01/18 0500 05/02/18 0400  NA 144 143 144  K 4.1 4.5 4.0  CL 113* 110 103  CO2 22 27 29   BUN 11 28* 29*  CREATININE 0.90 0.72 0.75  GLUCOSE 124* 159* 193*    Electrolytes Recent Labs  Lab 04/10/2018 1754  04/29/18 1705 04/30/18 0444 04/30/18 1702 05/01/18 0500 05/02/18 0400  CALCIUM 7.5*  --   --   --   --  8.4* 9.1  MG 1.8   < > 1.8 1.9 2.0  --   --   PHOS 3.6   < > 1.9* 2.1* 2.4*  --   --    < > = values in this interval not  displayed.    CBC Recent Labs  Lab 04/30/18 0444 05/01/18 0500 05/02/18 0400  WBC 14.5* 11.2* 11.2*  HGB 10.6* 10.1* 10.3*  HCT 34.9* 34.0* 34.2*  PLT 241 208 231    Coag's Recent Labs  Lab 04/10/2018 1754  INR 1.15    Sepsis Markers Recent Labs  Lab 04/21/2018 1750 04/10/2018 1754 04/23/2018 2207  LATICACIDVEN 1.3  --  1.8  PROCALCITON  --  1.72  --     ABG Recent Labs  Lab 05/01/18 0235 05/01/18 0910 05/02/18 0404  PHART 7.293* 7.353 7.394  PCO2ART 56.6* 49.6* 54.1*  PO2ART 95.0 117.0* 114.0*    Liver Enzymes Recent Labs  Lab 04/26/2018 1754  AST 88*  ALT 84*  ALKPHOS 54  BILITOT 0.3  ALBUMIN 3.1*    Cardiac Enzymes Recent Labs  Lab 04/27/2018 1754 04/20/2018 2206 04/29/18 0437  TROPONINI 0.99* 0.64* 0.51*    Glucose Recent Labs  Lab 05/01/18 1208 05/01/18 1558 05/01/18 1943 05/01/18 2332 05/02/18 0330 05/02/18 0755  GLUCAP 225* 174* 205* 189* 198* 208*    Imaging No results found.   STUDIES:  CT angio chest 6/27 [facility]) >> tumor encasement or b/l  PA, b/l hilar masses, centrilobular emphysema, septal thickening, scattered nodules Echo 6/30 >> EF 45 to 50%, grade 1 DD, mod LA dilation, mild MR, PAS 36 mmHg  CULTURES: Urine 6/27 >> negative Sputum 6/27 >> Pseudomonas aeruginosa Blood 6/27 >>   ANTIBIOTICS: Zithromax 6/27 >> 6/28 Rocephin 6/27 >> 6/28 Fortaz 6/29 >>  SIGNIFICANT EVENTS: 6/27 transfer from [place], cardiology consulted 6/30 self extubated >> stridor >> reintubated, start decadron  LINES/TUBES: ETT 6/27 >> 6/30 (self extubation Lt IJ CVL 6/27 >> Rt radial aline 6/27 >> 7/01 ETT 6/30 >>   DISCUSSION: 72 yo female smoker with acute on chronic hypoxic/hypercapnic respiratory failure from Pseudomonal pneumonia in setting of COPD with emphysema, lung cancer, systolic CHF.  Self extubated 6/30 with post extubation stridor and reintubated.  ASSESSMENT / PLAN:  Acute on chronic respiratory failure with  hypoxia, hypercapnia. - pressure support wean as able - not ready for extubation trial at this time  Pseudomonal pneumonia. - day 3 of fortaz  COPD with emphysema. - duoneb  Post-extubation stridor. - continue decadron  Hx of lung cancer. - will need to get records from [place]  HFrEF, HLD. - cardiology following  DM type II with steroid induced hyperglycemia. - SSI  Acute metabolic encephalopathy. - RASS goal 0 to -1 - buspar, remeron, paxil, seroquel  DVT prophylaxis - lovenox SUP - protonix Nutrition -  Goals of care - full code  Updated pt's husband at bedside  CC time 36 minutes  Chesley Mires, MD [place] 05/02/2018, 9:04 AM

## 2018-05-02 NOTE — Progress Notes (Signed)
Pt with swelling in fingers and both hands. Pt rings removed while pt husband and daughter at bedside. Gold colored ring no stone, gold colored ring with single diamond colored stone and gold colored ring with diamond colored stone clusters given to pt husband.

## 2018-05-02 NOTE — Care Management Note (Signed)
Case Management Note  Patient Details  Name: Taffie Eckmann MRN: [id number] Date of Birth: 1946/10/27  Subjective/Objective:   rom home, acute/chronic hypoxic /hypercapnic resp failure from pna, copd with emphysema, lung cancer, systolic chf, subendocardial mi, was extubated and reintubated, on full vent. Iv abx                 Action/Plan: NCM will follow for dc needs.  Expected Discharge Date:                  Expected Discharge Plan:     In-House Referral:     Discharge planning Services  CM Consult  Post Acute Care Choice:    Choice offered to:     DME Arranged:    DME Agency:     HH Arranged:    HH Agency:     Status of Service:  In process, will continue to follow  If discussed at Long Length of Stay Meetings, dates discussed:    Additional Comments:  Zenon Mayo, RN 05/02/2018, 1:48 PM

## 2018-05-02 NOTE — Progress Notes (Signed)
[facility] Progress Note Patient Name: Dawn Howe DOB: [birth year] MRN: [id number]   Date of Service  05/02/2018  HPI/Events of Note  Day rounding team failed to renew orders for bilateral soft wrist restraints.   eICU Interventions  Will renew orders for bilateral soft wrist restraints for 12 hours. Please be sure that day rounding team renews the orders tomorrow morning.      Intervention Category Minor Interventions: Agitation / anxiety - evaluation and management  Sommer,Steven Eugene 05/02/2018, 8:27 PM

## 2018-05-02 DEATH — deceased

## 2018-05-03 ENCOUNTER — Inpatient Hospital Stay (HOSPITAL_COMMUNITY): Payer: Medicare Other

## 2018-05-03 LAB — BASIC METABOLIC PANEL
Anion gap: 9 (ref 5–15)
BUN: 31 mg/dL — ABNORMAL HIGH (ref 8–23)
CALCIUM: 9.1 mg/dL (ref 8.9–10.3)
CO2: 32 mmol/L (ref 22–32)
CREATININE: 0.7 mg/dL (ref 0.44–1.00)
Chloride: 103 mmol/L (ref 98–111)
Glucose, Bld: 266 mg/dL — ABNORMAL HIGH (ref 70–99)
Potassium: 4.2 mmol/L (ref 3.5–5.1)
SODIUM: 144 mmol/L (ref 135–145)

## 2018-05-03 LAB — CBC
HCT: 34.6 % — ABNORMAL LOW (ref 36.0–46.0)
Hemoglobin: 10.5 g/dL — ABNORMAL LOW (ref 12.0–15.0)
MCH: 28.4 pg (ref 26.0–34.0)
MCHC: 30.3 g/dL (ref 30.0–36.0)
MCV: 93.5 fL (ref 78.0–100.0)
PLATELETS: 219 10*3/uL (ref 150–400)
RBC: 3.7 MIL/uL — AB (ref 3.87–5.11)
RDW: 13.5 % (ref 11.5–15.5)
WBC: 9.9 10*3/uL (ref 4.0–10.5)

## 2018-05-03 LAB — GLUCOSE, CAPILLARY
GLUCOSE-CAPILLARY: 201 mg/dL — AB (ref 70–99)
GLUCOSE-CAPILLARY: 271 mg/dL — AB (ref 70–99)
GLUCOSE-CAPILLARY: 145 mg/dL — AB (ref 70–99)
GLUCOSE-CAPILLARY: 95 mg/dL (ref 70–99)
Glucose-Capillary: 103 mg/dL — ABNORMAL HIGH (ref 70–99)
Glucose-Capillary: 123 mg/dL — ABNORMAL HIGH (ref 70–99)

## 2018-05-03 LAB — CULTURE, BLOOD (ROUTINE X 2)
Culture: NO GROWTH
Culture: NO GROWTH
SPECIAL REQUESTS: ADEQUATE
SPECIAL REQUESTS: ADEQUATE

## 2018-05-03 MED ORDER — FENTANYL CITRATE (PF) 100 MCG/2ML IJ SOLN
50.0000 ug | INTRAMUSCULAR | Status: DC | PRN
  Administered 2018-05-04 – 2018-05-13 (×8): 50 ug via INTRAVENOUS
  Filled 2018-05-03 (×8): qty 2

## 2018-05-03 MED ORDER — MIRTAZAPINE 30 MG PO TBDP
30.0000 mg | ORAL_TABLET | Freq: Every day | ORAL | Status: DC
  Filled 2018-05-03: qty 1

## 2018-05-03 MED ORDER — FUROSEMIDE 10 MG/ML IJ SOLN
40.0000 mg | Freq: Once | INTRAMUSCULAR | Status: AC
  Administered 2018-05-03: 40 mg via INTRAVENOUS
  Filled 2018-05-03: qty 4

## 2018-05-03 MED ORDER — SENNOSIDES-DOCUSATE SODIUM 8.6-50 MG PO TABS
1.0000 | ORAL_TABLET | Freq: Two times a day (BID) | ORAL | Status: DC | PRN
  Administered 2018-05-04 – 2018-05-06 (×4): 1 via ORAL
  Filled 2018-05-03 (×5): qty 1

## 2018-05-03 MED ORDER — BUSPIRONE HCL 15 MG PO TABS
15.0000 mg | ORAL_TABLET | Freq: Every day | ORAL | Status: DC
  Administered 2018-05-03: 15 mg via ORAL
  Filled 2018-05-03: qty 1

## 2018-05-03 MED ORDER — ACETAMINOPHEN 160 MG/5ML PO SOLN: 650.0000 mg | ORAL | Status: DC | PRN

## 2018-05-03 MED ORDER — PAROXETINE HCL 20 MG PO TABS
40.0000 mg | ORAL_TABLET | Freq: Every day | ORAL | Status: DC
  Administered 2018-05-03: 40 mg via ORAL

## 2018-05-03 MED ORDER — POLYETHYLENE GLYCOL 3350 17 G PO PACK
17.0000 g | PACK | Freq: Every day | ORAL | Status: DC | PRN
  Administered 2018-05-04 – 2018-05-05 (×2): 17 g via ORAL
  Filled 2018-05-03 (×2): qty 1

## 2018-05-03 MED ORDER — PAROXETINE HCL 30 MG PO TABS: 50.0000 mg | ORAL_TABLET | Freq: Every day | ORAL | Status: DC

## 2018-05-03 MED ORDER — ATORVASTATIN CALCIUM 20 MG PO TABS
20.0000 mg | ORAL_TABLET | Freq: Every day | ORAL | Status: DC
  Administered 2018-05-03: 20 mg via ORAL
  Filled 2018-05-03: qty 1

## 2018-05-03 MED ORDER — QUETIAPINE FUMARATE 100 MG PO TABS: 100.0000 mg | ORAL_TABLET | Freq: Two times a day (BID) | ORAL | Status: DC

## 2018-05-03 MED ORDER — FAMOTIDINE IN NACL 20-0.9 MG/50ML-% IV SOLN
20.0000 mg | Freq: Two times a day (BID) | INTRAVENOUS | Status: DC
  Administered 2018-05-03 (×2): 20 mg via INTRAVENOUS
  Filled 2018-05-03 (×2): qty 50

## 2018-05-03 NOTE — Progress Notes (Signed)
Progress Note  Patient Name: Dawn Howe Date of Encounter: 05/03/2018  Primary Cardiologist: Dr. Johnsie Cancel   Subjective   Pt extubated today. Answering questions appropriately. Trouble with post-extubation wheezing.   Inpatient Medications    Scheduled Meds: . atorvastatin  20 mg Oral q1800  . busPIRone  15 mg Oral Daily  . chlorhexidine gluconate (MEDLINE KIT)  15 mL Mouth Rinse BID  . dexamethasone  6 mg Intravenous Q6H  . enoxaparin (LOVENOX) injection  40 mg Subcutaneous Q24H  . insulin aspart  0-15 Units Subcutaneous Q4H  . ipratropium-albuterol  3 mL Nebulization Q6H  . mouth rinse  15 mL Mouth Rinse 10 times per day  . mirtazapine  30 mg Oral QHS  . PARoxetine  40 mg Oral Daily   Continuous Infusions: . sodium chloride Stopped (04/30/18 1616)  . cefTAZidime (FORTAZ)  IV 10 mL/hr at 05/03/18 0300  . dexmedetomidine (PRECEDEX) IV infusion Stopped (05/03/18 0936)  . famotidine (PEPCID) IV 100 mL/hr at 05/03/18 1000   PRN Meds: sodium chloride, acetaminophen (TYLENOL) oral liquid 160 mg/5 mL, albuterol, fentaNYL (SUBLIMAZE) injection, polyethylene glycol, senna-docusate   Vital Signs    Vitals:   05/03/18 0743 05/03/18 0800 05/03/18 0900 05/03/18 0954  BP:  (!) 152/80 135/79   Pulse:  100 (!) 105 (!) 125  Resp:  18 19 (!) 29  Temp:  99 F (37.2 C) 99 F (37.2 C)   TempSrc:      SpO2: 99% 98% 97%   Weight:      Height:        Intake/Output Summary (Last 24 hours) at 05/03/2018 1106 Last data filed at 05/03/2018 1000 Gross per 24 hour  Intake 2387.6 ml  Output 1275 ml  Net 1112.6 ml   Filed Weights   05/01/18 0359 05/02/18 0500 05/03/18 0500  Weight: 185 lb 3 oz (84 kg) 182 lb 5.1 oz (82.7 kg) 184 lb 1.4 oz (83.5 kg)    Physical Exam   General: Elderly, NAD Skin: Warm, dry, intact  Head: Normocephalic, atraumatic, clear, moist mucus membranes. Neck: Negative for carotid bruits. No JVD Lungs: Bilateral expiratory wheezing and rhonchi  throughout. Breathing is mildly labored. Cardiovascular: RRR with S1 S2. No murmurs, rubs, gallops, or LV heave appreciated. Abdomen: Soft, non-tender, non-distended with normoactive bowel sounds. No obvious abdominal masses. MSK: Strength and tone appear normal for age. 5/5 in all extremities Extremities:  MIld 1+ UE edema. No clubbing or cyanosis. DP/PT pulses 2+ bilaterally Neuro: Alert and oriented. No focal deficits. No facial asymmetry. MAE spontaneously. Psych: Responds to questions appropriately with normal affect.    Labs    Chemistry Recent Labs  Lab 04/20/2018 1754 05/01/18 0500 05/02/18 0400 05/03/18 0735  NA 144 143 144 144  K 4.1 4.5 4.0 4.2  CL 113* 110 103 103  CO2 _0 32  GLUCOSE 124* 159* 193* 266*  BUN 11 28* 29* 31*  CREATININE 0.90 0.72 0.75 0.70  CALCIUM 7.5* 8.4* 9.1 9.1  PROT 5.6*  --   --   --   ALBUMIN 3.1*  --   --   --   AST 88*  --   --   --   ALT 84*  --   --   --   ALKPHOS 54  --   --   --   BILITOT 0.3  --   --   --   GFRNONAA >60 >60 >60 >60  GFRAA >60 >60 >60 >60  ANIONGAP _0 Hematology Recent Labs  Lab 05/01/18 0500 05/02/18 0400 05/03/18 0735  WBC 11.2* 11.2* 9.9  RBC 3.61* 3.63* 3.70*  HGB 10.1* 10.3* 10.5*  HCT 34.0* 34.2* 34.6*  MCV 94.2 94.2 93.5  MCH 28.0 28.4 28.4  MCHC 29.7* 30.1 30.3  RDW 13.5 13.3 13.5  PLT 208 231 219   Cardiac Enzymes Recent Labs  Lab 04/07/2018 1754 04/07/2018 2206 04/29/18 0437  TROPONINI 0.99* 0.64* 0.51*   No results for input(s): TROPIPOC in the last 168 hours.   BNPNo results for input(s): BNP, PROBNP in the last 168 hours.   DDimer No results for input(s): DDIMER in the last 168 hours.   Radiology    Dg Chest Port 1 View  Result Date: 05/03/2018 CLINICAL DATA:  Follow-up respiratory failure.  Ventilator support. EXAM: PORTABLE CHEST 1 VIEW COMPARISON:  05/01/2018 FINDINGS: Endotracheal tube tip remains 4 cm above the carina. Nasogastric tube enters the stomach. Left  internal jugular central line tip at the SVC azygos level. None right hilar mass with hilar and mediastinal adenopathy in probable interstitial spread of carcinoma. Persistent findings of COPD with additional mild volume loss in the lower lobes. No consolidation or lobar collapse. IMPRESSION: No significant change. Lines and tubes well positioned. Right hilar mass with interstitial spread of cancer, better shown at CT. Mild lower lung volume loss. Electronically Signed   By: Nelson Chimes M.D.   On: 05/03/2018 07:06   Telemetry    05/03/18 ST  - Personally Reviewed  ECG    No new tracings as of 05/03/18- Personally Reviewed  Cardiac Studies   Echocardiogram 05/01/18: Study Conclusions  - Left ventricle: The cavity size was normal. Wall thickness was normal. Systolic function was mildly reduced. The estimated ejection fraction was in the range of 45% to 50%. Diffuse hypokinesis. Doppler parameters are consistent with abnormal left ventricular relaxation (grade 1 diastolic dysfunction). - Mitral valve: There was mild regurgitation. - Left atrium: The atrium was moderately dilated. - Pulmonary arteries: PA peak pressure: 36 mm Hg (S). - Pericardium, extracardiac: A small pericardial effusion was identified.  Recommendations: Mild global reduction in LV systolic function; mild diastolic dysfunction; mild MR; moderate LAE; cannot R/O mass compressing right atrium on subcostal images; suggest cardiac CTA to further assess; small pericardial effusion. Negative saline microcavitation study.  Patient Profile     72 y.o. female with a hx of COPD with chronic respiratory failure on home oxygen, lung cancer status post radiation and chemo, hypertension, hyperlipidemiawho is being followed by Cardiology for the evaluation ofCHFat the request of Dr. Nelda Marseille.  Assessment & Plan    1. SEMI (subendocardial MI): -Pt admitted with acute repsiratory failure, transferred from [facility] and found to have an elevated trop of 0.66 -Trop, 0.99>0.64>0.51 -Likely demand ischemia in the setting of acute respiratory failure>>possible progression of lung CA>>>recommendations have been made for continued medical therapy -Echo with LVEF of 45-50% with diffuse hypokinesis and G1DD -CT chest showed hilar mass with b/l pulmonary artery involvement and possible lymphangitic spread -Continue statin -IV Lasix 85m x1 given yesterday for mild fluid volume overload>>good response -Would consider one additional dose given improvement in creatinine  2. Acute systolic heart failure: -Per echocardiogram with LVEF of 45% to 50% with diffuse hypokinesis and grade 1 diastolic dysfunction performed 05/01/18 -Hx of chemo/radiation for lung CA  -Bubble study negative for R>L shunting  -BNP on admission, 544 -CT chest showed hilar mass with b/l  pulmonary artery involvement and possible lymphangitic spread -IV Lasix 62m x1 given for mild fluid volume overload on exam, 05/02/18 -Weight, 184lb today, 187lb on admission  -I&O, net negative 1.2L, 24 H total output 1.2L -Creatinine, 0.70 today   3. Acute respiratory failure: -Extubated today, problems with post extubation wheezing>> per PCCM   4. HTN: -Stabilizing, 135/79>152/80>142/76 -Not currently on antihypertensives   5. Tobacco use: -Nicotine patch in place   6. COPD: -Per primary team   Signed, JKathyrn DrownNP-C [facility] Pager: [phone]/12/2017, 11:06 AM     For questions or updates, please contact   Please consult [email or web address] for contact info under Cardiology/STEMI.

## 2018-05-03 NOTE — Procedures (Signed)
Extubation Procedure Note  Patient Details:   Name: Dawn Howe DOB: [birth year] MRN: [id number]   Airway Documentation:    Vent end date: 05/03/18 Vent end time: 0952   Evaluation  O2 sats: stable throughout Complications: No apparent complications Patient did tolerate procedure well. Bilateral Breath Sounds: Expiratory wheezes   Yes  Patient had positive cuff leak and was extubated to 4 LPM nasal cannula. Patient had strong productive cough and able to vocalize name. No stridor noted.   Dawn Howe M 05/03/2018, 5:06 PM

## 2018-05-03 NOTE — Progress Notes (Signed)
Inpatient Diabetes Program Recommendations  AACE/ADA: New Consensus Statement on Inpatient Glycemic Control (2015)  Target Ranges:  Prepandial:   less than 140 mg/dL      Peak postprandial:   less than 180 mg/dL (1-2 hours)      Critically ill patients:  140 - 180 mg/dL   Lab Results  Component Value Date   GLUCAP 271 (H) 05/03/2018   HGBA1C 6.6 (H) 04/29/2018    Review of Glycemic Control  Blood sugars in 200s this am. On Novolog 0-15 units Q4H.  Inpatient Diabetes Program Recommendations:     ICU Glycemic Control Order Set.  Will continue to closely monitor.  Thank you. Lorenda Peck, RD, LDN, CDE Inpatient Diabetes Coordinator [phone]

## 2018-05-03 NOTE — Progress Notes (Signed)
180mg  of Fentanyl wasted in sink with Marian Sorrow RN.

## 2018-05-03 NOTE — Progress Notes (Addendum)
PULMONARY / CRITICAL CARE MEDICINE   Name: Dawn Howe MRN: [id number] DOB: 17-Jun-1946    ADMISSION DATE:  04/16/2018  REFERRING MD:  Oval Linsey  CHIEF COMPLAINT:  Short of breath  HISTORY OF PRESENT ILLNESS:   72 yo female smoker travelling from [place] presented to [facility] hospital with dyspnea, and near syncope.  Required intubation for hypoxia/hypercapnia.  CT chest showed hilar mass with b/l pulmonary artery involvement and possible lymphangitic spread.  Echo showed systolic dysfunction.  PAST MEDICAL HISTORY :  Lung cancer s/p chemoradiation, COPD on 2 liters, CAD, CHF, HTN.  SUBJECTIVE:  Tolerating SBT.  Has cuff leak.  Husband reports that cancer doctors in [place] told them that her cancer wasn't an issue and she didn't need additional therapy.  He reports she had PET scan about 3 months ago.  VITAL SIGNS: BP (!) 152/80   Pulse 100   Temp 99 F (37.2 C)   Resp 18   Ht 5\' 4"  (1.626 m)   Wt 184 lb 1.4 oz (83.5 kg)   SpO2 98%   BMI 31.60 kg/m   HEMODYNAMICS: CVP:  [12 mmHg-30 mmHg] 12 mmHg  VENTILATOR SETTINGS: Vent Mode: PSV;CPAP FiO2 (%):  [40 %] 40 % Set Rate:  [24 bmp] 24 bmp Vt Set:  [430 mL] 430 mL PEEP:  [5 cmH20] 5 cmH20 Pressure Support:  [5 cmH20-8 cmH20] 5 cmH20 Plateau Pressure:  [20 VEL38-10 cmH20] 21 cmH20  INTAKE / OUTPUT: I/O last 3 completed shifts: In: 3517.5 [I.V.:1332.5; Other:325; NG/GT:1560; IV FBPZWCHEN:277] Out: 1825 [OEUMP:5361; Emesis/NG output:550]  PHYSICAL EXAMINATION:  General - sedated Eyes - pupils reactive ENT - ETT in place Cardiac - regular, no murmur Chest - scattered rhonchi Abd - soft, non tender Ext - no edema Skin - no rashes Neuro - RASS 0  LABS:  BMET Recent Labs  Lab 05/01/18 0500 05/02/18 0400 05/03/18 0735  NA 143 144 144  K 4.5 4.0 4.2  CL 110 103 103  CO2 27 29 32  BUN 28* 29* 31*  CREATININE 0.72 0.75 0.70  GLUCOSE 159* 193* 266*    Electrolytes Recent Labs  Lab 04/29/18 1705  04/30/18 0444 04/30/18 1702 05/01/18 0500 05/02/18 0400 05/03/18 0735  CALCIUM  --   --   --  8.4* 9.1 9.1  MG 1.8 1.9 2.0  --   --   --   PHOS 1.9* 2.1* 2.4*  --   --   --     CBC Recent Labs  Lab 05/01/18 0500 05/02/18 0400 05/03/18 0735  WBC 11.2* 11.2* 9.9  HGB 10.1* 10.3* 10.5*  HCT 34.0* 34.2* 34.6*  PLT 208 231 219    Coag's Recent Labs  Lab 04/20/2018 1754  INR 1.15    Sepsis Markers Recent Labs  Lab 04/11/2018 1750 04/11/2018 1754 04/16/2018 2207  LATICACIDVEN 1.3  --  1.8  PROCALCITON  --  1.72  --     ABG Recent Labs  Lab 05/01/18 0235 05/01/18 0910 05/02/18 0404  PHART 7.293* 7.353 7.394  PCO2ART 56.6* 49.6* 54.1*  PO2ART 95.0 117.0* 114.0*    Liver Enzymes Recent Labs  Lab 04/08/2018 1754  AST 88*  ALT 84*  ALKPHOS 54  BILITOT 0.3  ALBUMIN 3.1*    Cardiac Enzymes Recent Labs  Lab 04/06/2018 1754 04/25/2018 2206 04/29/18 0437  TROPONINI 0.99* 0.64* 0.51*    Glucose Recent Labs  Lab 05/02/18 1149 05/02/18 1641 05/02/18 1943 05/02/18 2312 05/03/18 0308 05/03/18 0825  GLUCAP 252* 189* 185* 223* 201*  271*    Imaging Dg Chest Port 1 View  Result Date: 05/03/2018 CLINICAL DATA:  Follow-up respiratory failure.  Ventilator support. EXAM: PORTABLE CHEST 1 VIEW COMPARISON:  05/01/2018 FINDINGS: Endotracheal tube tip remains 4 cm above the carina. Nasogastric tube enters the stomach. Left internal jugular central line tip at the SVC azygos level. None right hilar mass with hilar and mediastinal adenopathy in probable interstitial spread of carcinoma. Persistent findings of COPD with additional mild volume loss in the lower lobes. No consolidation or lobar collapse. IMPRESSION: No significant change. Lines and tubes well positioned. Right hilar mass with interstitial spread of cancer, better shown at CT. Mild lower lung volume loss. Electronically Signed   By: Nelson Chimes M.D.   On: 05/03/2018 07:06     STUDIES:  CT angio chest 6/27  [facility]) >> tumor encasement or b/l PA, b/l hilar masses, centrilobular emphysema, septal thickening, scattered nodules Echo 6/30 >> EF 45 to 50%, grade 1 DD, mod LA dilation, mild MR, PAS 36 mmHg  CULTURES: Urine 6/27 >> negative Sputum 6/27 >> Pseudomonas aeruginosa Blood 6/27 >>   ANTIBIOTICS: Zithromax 6/27 >> 6/28 Rocephin 6/27 >> 6/28 Fortaz 6/29 >>  SIGNIFICANT EVENTS: 6/27 transfer from [place], cardiology consulted 6/30 self extubated >> stridor >> reintubated, start decadron  LINES/TUBES: ETT 6/27 >> 6/30 (self extubation) Lt IJ CVL 6/27 >> Rt radial aline 6/27 >> 7/01 ETT 6/30 >> 7/02  DISCUSSION: 72 yo female smoker with acute on chronic hypoxic/hypercapnic respiratory failure from Pseudomonal pneumonia in setting of COPD with emphysema, lung cancer, systolic CHF.  Self extubated 6/30 with post extubation stridor and reintubated.  On pressure support.  Has cuff leak.  ASSESSMENT / PLAN:  Acute on chronic respiratory failure with hypoxia, hypercapnia. - trial of extubation 7/02 - family okay with reintubation and trach if she fails extubation trial  Pseudomonal pneumonia. - day 4/7 of Fortaz  COPD with emphysema. - scheduled duoneb  Post-extubation stridor. - continue decadron until after extubation; if she tolerates extubation, can then start to wean off  Hx of lung cancer. - husband reports that her cancer doctors in [place] said her cancer was controlled - have asked husband to get contact information for oncology team in [place]  Acute systolic CHF. NSTEMI Type II. Hx of HLD. 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 - SSI  Dysphagia. - will ask speech to assess after extubation  Acute metabolic encephalopathy. Hx of depression, anxiety. - buspar, remeron, paxil - d/c seroquel  DVT prophylaxis - lovenox SUP - pepcid Nutrition - NPO Goals of care - full code  Updated  husband at bedside.  CC time 34 minutes  Chesley Mires, MD [facility] Pulmonary/Critical Care 05/03/2018, 9:10 AM

## 2018-05-04 ENCOUNTER — Inpatient Hospital Stay (HOSPITAL_COMMUNITY): Payer: Medicare Other

## 2018-05-04 LAB — CBC
HEMATOCRIT: 35 % — AB (ref 36.0–46.0)
Hemoglobin: 10.5 g/dL — ABNORMAL LOW (ref 12.0–15.0)
MCH: 28.5 pg (ref 26.0–34.0)
MCHC: 30 g/dL (ref 30.0–36.0)
MCV: 94.9 fL (ref 78.0–100.0)
PLATELETS: 248 10*3/uL (ref 150–400)
RBC: 3.69 MIL/uL — ABNORMAL LOW (ref 3.87–5.11)
RDW: 13.7 % (ref 11.5–15.5)
WBC: 12.9 10*3/uL — AB (ref 4.0–10.5)

## 2018-05-04 LAB — BLOOD GAS, ARTERIAL
ACID-BASE EXCESS: 8.4 mmol/L — AB (ref 0.0–2.0)
Acid-Base Excess: 5.7 mmol/L — ABNORMAL HIGH (ref 0.0–2.0)
BICARBONATE: 33.6 mmol/L — AB (ref 20.0–28.0)
Bicarbonate: 32.7 mmol/L — ABNORMAL HIGH (ref 20.0–28.0)
DRAWN BY: 51806
Delivery systems: POSITIVE
Drawn by: 44135
EXPIRATORY PAP: 5
FIO2: 40
FIO2: 60
Inspiratory PAP: 16
LHR: 18 {breaths}/min
O2 SAT: 98.8 %
O2 Saturation: 95.1 %
PATIENT TEMPERATURE: 98.6
PCO2 ART: 93.1 mmHg — AB (ref 32.0–48.0)
PEEP: 5 cmH2O
PO2 ART: 104 mmHg (ref 83.0–108.0)
PO2 ART: 162 mmHg — AB (ref 83.0–108.0)
Patient temperature: 99
VT: 500 mL
pCO2 arterial: 47.8 mmHg (ref 32.0–48.0)
pH, Arterial: 7.183 — CL (ref 7.350–7.450)
pH, Arterial: 7.45 (ref 7.350–7.450)

## 2018-05-04 LAB — PHOSPHORUS
PHOSPHORUS: 4.7 mg/dL — AB (ref 2.5–4.6)
Phosphorus: 4 mg/dL (ref 2.5–4.6)
Phosphorus: 3.9 mg/dL (ref 2.5–4.6)

## 2018-05-04 LAB — MAGNESIUM
MAGNESIUM: 2.2 mg/dL (ref 1.7–2.4)
MAGNESIUM: 2.3 mg/dL (ref 1.7–2.4)
Magnesium: 2.2 mg/dL (ref 1.7–2.4)

## 2018-05-04 LAB — BASIC METABOLIC PANEL
Anion gap: 6 (ref 5–15)
BUN: 37 mg/dL — ABNORMAL HIGH (ref 8–23)
CHLORIDE: 102 mmol/L (ref 98–111)
CO2: 36 mmol/L — ABNORMAL HIGH (ref 22–32)
CREATININE: 0.68 mg/dL (ref 0.44–1.00)
Calcium: 9 mg/dL (ref 8.9–10.3)
GFR calc Af Amer: >60 mL/min (ref >60)
GFR calc non Af Amer: >60 mL/min (ref >60)
Glucose, Bld: 117 mg/dL — ABNORMAL HIGH (ref 70–99)
POTASSIUM: 4.2 mmol/L (ref 3.5–5.1)
SODIUM: 144 mmol/L (ref 135–145)

## 2018-05-04 LAB — GLUCOSE, CAPILLARY
GLUCOSE-CAPILLARY: 188 mg/dL — AB (ref 70–99)
GLUCOSE-CAPILLARY: 235 mg/dL — AB (ref 70–99)
Glucose-Capillary: 103 mg/dL — ABNORMAL HIGH (ref 70–99)
Glucose-Capillary: 160 mg/dL — ABNORMAL HIGH (ref 70–99)
Glucose-Capillary: 243 mg/dL — ABNORMAL HIGH (ref 70–99)

## 2018-05-04 MED ORDER — ALTEPLASE 2 MG IJ SOLR
2.0000 mg | Freq: Once | INTRAMUSCULAR | Status: AC
  Administered 2018-05-04: 2 mg
  Filled 2018-05-04: qty 2

## 2018-05-04 MED ORDER — SUCCINYLCHOLINE CHLORIDE 20 MG/ML IJ SOLN
100.0000 mg | Freq: Once | INTRAMUSCULAR | Status: AC
Start: 2018-05-04 — End: 2018-05-04
  Administered 2018-05-04: 100 mg via INTRAVENOUS
  Filled 2018-05-04: qty 5

## 2018-05-04 MED ORDER — PAROXETINE HCL 20 MG PO TABS
40.0000 mg | ORAL_TABLET | Freq: Every day | ORAL | Status: DC
  Administered 2018-05-04 – 2018-05-06 (×3): 40 mg
  Filled 2018-05-04 (×3): qty 2

## 2018-05-04 MED ORDER — VITAL HIGH PROTEIN PO LIQD: 1000.0000 mL | ORAL | Status: DC

## 2018-05-04 MED ORDER — ATORVASTATIN CALCIUM 20 MG PO TABS
20.0000 mg | ORAL_TABLET | Freq: Every day | ORAL | Status: DC
Start: 2018-05-04 — End: 2018-05-06
  Administered 2018-05-04 – 2018-05-05 (×2): 20 mg
  Filled 2018-05-04 (×2): qty 1

## 2018-05-04 MED ORDER — BUSPIRONE HCL 15 MG PO TABS
15.0000 mg | ORAL_TABLET | Freq: Every day | ORAL | Status: DC
  Administered 2018-05-04 – 2018-05-06 (×3): 15 mg
  Filled 2018-05-04 (×3): qty 1

## 2018-05-04 MED ORDER — ACETAMINOPHEN 160 MG/5ML PO SOLN
650.0000 mg | ORAL | Status: DC | PRN
  Administered 2018-05-04 – 2018-05-06 (×2): 650 mg
  Filled 2018-05-04 (×2): qty 20.3

## 2018-05-04 MED ORDER — MORPHINE SULFATE (PF) 2 MG/ML IV SOLN
INTRAVENOUS | Status: AC
  Filled 2018-05-04: qty 1

## 2018-05-04 MED ORDER — PRO-STAT SUGAR FREE PO LIQD: 30.0000 mL | Freq: Every day | ORAL | Status: DC

## 2018-05-04 MED ORDER — PANTOPRAZOLE SODIUM 40 MG PO PACK
40.0000 mg | PACK | ORAL | Status: DC
  Administered 2018-05-04 – 2018-05-06 (×3): 40 mg
  Filled 2018-05-04 (×3): qty 20

## 2018-05-04 MED ORDER — MORPHINE SULFATE (PF) 2 MG/ML IV SOLN
2.0000 mg | Freq: Once | INTRAVENOUS | Status: AC
  Administered 2018-05-04: 2 mg via INTRAVENOUS

## 2018-05-04 MED ORDER — ETOMIDATE 2 MG/ML IV SOLN
25.0000 mg | Freq: Once | INTRAVENOUS | Status: AC
  Administered 2018-05-04: 25 mg via INTRAVENOUS

## 2018-05-04 MED ORDER — FUROSEMIDE 10 MG/ML IJ SOLN
40.0000 mg | Freq: Once | INTRAMUSCULAR | Status: DC
  Filled 2018-05-04: qty 4

## 2018-05-04 MED ORDER — PRO-STAT SUGAR FREE PO LIQD
30.0000 mL | Freq: Every day | ORAL | Status: DC
  Administered 2018-05-04 – 2018-05-06 (×3): 30 mL
  Filled 2018-05-04 (×3): qty 30

## 2018-05-04 MED ORDER — VITAL HIGH PROTEIN PO LIQD
1000.0000 mL | ORAL | Status: DC
  Administered 2018-05-04 – 2018-05-05 (×2): 1000 mL

## 2018-05-04 MED ORDER — LEVALBUTEROL HCL 0.63 MG/3ML IN NEBU
INHALATION_SOLUTION | RESPIRATORY_TRACT | Status: AC
  Administered 2018-05-04: 0.63 mg
  Filled 2018-05-04: qty 3

## 2018-05-04 MED ORDER — FUROSEMIDE 10 MG/ML IJ SOLN
40.0000 mg | Freq: Once | INTRAMUSCULAR | Status: AC
  Administered 2018-05-04: 40 mg via INTRAVENOUS
  Filled 2018-05-04: qty 4

## 2018-05-04 MED ORDER — METHYLPREDNISOLONE SODIUM SUCC 40 MG IJ SOLR
40.0000 mg | Freq: Four times a day (QID) | INTRAMUSCULAR | Status: DC
  Administered 2018-05-04 – 2018-05-07 (×14): 40 mg via INTRAVENOUS
  Filled 2018-05-04 (×11): qty 1

## 2018-05-04 MED ORDER — MIRTAZAPINE 30 MG PO TBDP
30.0000 mg | ORAL_TABLET | Freq: Every day | ORAL | Status: DC
  Administered 2018-05-04 – 2018-05-05 (×2): 30 mg
  Filled 2018-05-04 (×2): qty 1

## 2018-05-04 MED ORDER — PRO-STAT SUGAR FREE PO LIQD: 30.0000 mL | Freq: Two times a day (BID) | ORAL | Status: DC

## 2018-05-04 MED ORDER — MIDAZOLAM HCL 2 MG/2ML IJ SOLN
INTRAMUSCULAR | Status: AC
  Administered 2018-05-04: 2 mg
  Filled 2018-05-04: qty 2

## 2018-05-04 MED ORDER — MIDAZOLAM HCL 2 MG/2ML IJ SOLN: 2.0000 mg | Freq: Once | INTRAMUSCULAR | Status: AC

## 2018-05-04 NOTE — Progress Notes (Signed)
Spoke with Dr. Donneta Romberg in [place].  He reports that Ms. Ekman's lung cancer was from several years ago, and has been in remission.  Current findings on recent CT chest might represent new process.    Will need to discuss with pt's husband, and then determine if she might need bronchoscopy when more stable.  Chesley Mires, MD [facility] Pulmonary/Critical Care 05/04/2018, 5:15 PM

## 2018-05-04 NOTE — Progress Notes (Addendum)
Patient began to have excessive work of breathing and became tachycardic. Patient was put on a non-rebreather with slight improvement. Charge nurse and Elink MD were notified who then called the floor team and ordered a Bipap to be placed on patient STAT. Floor team is currently at bedside.

## 2018-05-04 NOTE — Progress Notes (Signed)
eLink Physician-Brief Progress Note Patient Name: Teya Otterson DOB: 1945/11/08 MRN: [id number]   Date of Service  05/04/2018  HPI/Events of Note  Hypoxia - Sats decreased into 70's and RR increased into 30's. Now on 100% NRBM.   eICU Interventions  Will order: 1. BIPAP - IPAP 12/EPAP 5.  2. Ground team to evaluate at bedside.      Intervention Category Intermediate Interventions: Respiratory distress - evaluation and management  Orby Tangen Eugene 05/04/2018, 6:26 AM

## 2018-05-04 NOTE — Progress Notes (Addendum)
Nutrition Follow-up  DOCUMENTATION CODES:   Obesity unspecified  INTERVENTION:   Restart Tube Feeding:  Vital High Protein @ 45 ml/hr Pro-Stat 30 mL daily Provides 1180 kcals, 110 g of protein and 875 mL of free water  NUTRITION DIAGNOSIS:   Inadequate oral intake related to acute illness as evidenced by NPO status.  Ongoing  GOAL:   Provide needs based on ASPEN/SCCM guidelines  Met  MONITOR:   Vent status, TF tolerance, Labs, Weight trends  REASON FOR ASSESSMENT:   Consult, Ventilator Enteral/tube feeding initiation and management  ASSESSMENT:   72 yo female presenting from [facility] where she was intubated in ER for admitted with acute on chronic respiratory failure. Pt transferred to [facility] for probable NSTEMI and cardiology evaluation. Pt with AECOPD, possible postobstructive pneumonia Pt with hx of COPD on 2L [place] at baseline, CAD, HTN, lung cancer s/p radiation and chemo. ECHO with EF 20%   6/30- self extubated, re-intubated 7/2- extubated 7/3- hypoxia, AMS- intubated  Pt extubated for short period of time but unable to safely maintain airway.  RD consulted for re-initiation of tube feeding.  Will begin previous regimen via OGT as needs remain the same.  Pt has not had a bowel movement since admit. Recommend bowel regimen.   Patient is currently intubated on ventilator support MV: 9.0 L/min Temp (24hrs), Avg:99.2 F (37.3 C), Min:98.4 F (36.9 C), Max:99.5 F (37.5 C) BP: 88/91 MAP: 70  I/O: -3755 ml since admit UOP: 3805 ml x 24 hrs  Weight down 4 lb since last RD visit on 6/28 (186 lb to 182 lb).   Medications reviewed and include: solumedrol, precedex Labs reviewed: Phosphorus 4.7 (H)  Diet Order:   Diet Order           Diet NPO time specified  Diet effective now          EDUCATION NEEDS:   Not appropriate for education at this time  Skin:  Skin Assessment: Reviewed RN Assessment  Last BM:  PTA  Height:   Ht Readings  from Last 1 Encounters:  04/29/18 '5\' 4"'$  (1.626 m)    Weight:   Wt Readings from Last 1 Encounters:  05/04/18 182 lb 12.2 oz (82.9 kg)    Ideal Body Weight:  54.5 kg  BMI:  Body mass index is 31.37 kg/m.  Estimated Nutritional Needs:   Kcal:  950-9326 kcals  Protein:  110-120 g  Fluid:  >/= 1.5 L    Mariana Single RD, LDN Clinical Nutrition Pager # [phone]

## 2018-05-04 NOTE — Progress Notes (Signed)
OVERNIGHT COVERAGE CRITICAL CARE PROGRESS NOTE  CTSP re: respiratory distress. Liberated from vent yesterday (7/2) and reportedly did well throughout most of the night and was only needing nasal cannula oxygen. However, nurse reports that patient suddenly complained of acute respiratory distress and demonstrated tripodding and increased work of breathing. The patient became tachypneic and demonstrated oxygen desaturation. She was already on Precedex at the time of clinical examination.  Upon my arrival to evaluate the patient, the patient was being placed on BiPAP with prompt improvement in oxygenation but continued to demonstrate labored breathing. Temp 97.4. HR 130, SBP 200+, RR 30. SpO2 95-98% on BiPAP 15/5. Mental status was lethargic but arousable. Mental status did improve over the next several minutes, presumably with improvement in oxygenation and ventilatory support. Xopenex was administered due to tachycardia and progerssively worsening HYPERtension. No reported relief. Morphine 2 mg IV single dose was administered along with furosemide 40 mg IV single dose for clinical suspicion of pulmonary edema.  After 1 hour of monitoring at the bedside, clinical status was discussed with the patient's husband, who reiterated that he expected the patient to be intubated if needed.  Lab Results  Component Value Date   PHART 7.183 (LL) 05/04/2018   PCO2ART 93.1 (HH) 05/04/2018   PO2ART 104 05/04/2018   Reintubated with 7.5 ETT with 23 cm at the lip.  Critical care time: 60 minutes.  The treatment and management of the patient's condition was required based on the threat of imminent deterioration. This time reflects time spent by the physician evaluating, providing care and managing the critically ill patient's care. The time was spent at the immediate bedside (or on the same floor/unit and dedicated to this patient's care). Time involved in separately billable procedures is NOT included int he critical  care time indicated above. Family meeting and update time may be included above if and only if the patient is unable/incompetent to participate in clinical interview and/or decision making, and the discussion was necessary to determining treatment decisions.  Renee Pain, MD Board Certified by the ABIM, [place]

## 2018-05-04 NOTE — Progress Notes (Signed)
Progress Note  Patient Name: Dawn Howe Date of Encounter: 05/04/2018  Primary Cardiologist: Dr. Johnsie Cancel   Subjective   Per patient's husband at beside, patient did well initially post extubation, but then earlier this morning had an episode of respiratory distress and was reintubated. She is sedated currently.   Inpatient Medications    Scheduled Meds: . atorvastatin  20 mg Per Tube q1800  . busPIRone  15 mg Per Tube Daily  . chlorhexidine gluconate (MEDLINE KIT)  15 mL Mouth Rinse BID  . enoxaparin (LOVENOX) injection  40 mg Subcutaneous Q24H  . feeding supplement (PRO-STAT SUGAR FREE 64)  30 mL Per Tube BID  . feeding supplement (VITAL HIGH PROTEIN)  1,000 mL Per Tube Q24H  . furosemide  40 mg Intravenous Once  . insulin aspart  0-15 Units Subcutaneous Q4H  . ipratropium-albuterol  3 mL Nebulization Q6H  . mouth rinse  15 mL Mouth Rinse 10 times per day  . methylPREDNISolone (SOLU-MEDROL) injection  40 mg Intravenous Q6H  . mirtazapine  30 mg Per Tube QHS  . pantoprazole sodium  40 mg Per Tube Q24H  . PARoxetine  40 mg Per Tube Daily   Continuous Infusions: . sodium chloride Stopped (04/30/18 1616)  . cefTAZidime (FORTAZ)  IV Stopped (05/04/18 0339)  . dexmedetomidine (PRECEDEX) IV infusion 0.5 mcg/kg/hr (05/04/18 0753)   PRN Meds: sodium chloride, acetaminophen (TYLENOL) oral liquid 160 mg/5 mL, albuterol, fentaNYL (SUBLIMAZE) injection, polyethylene glycol, senna-docusate   Vital Signs    Vitals:   05/04/18 0800 05/04/18 0821 05/04/18 0900 05/04/18 0952  BP: 110/85  90/61   Pulse: 98  92   Resp: 16  18   Temp: 99.5 F (37.5 C)  99.1 F (37.3 C)   TempSrc:      SpO2: 100% 100% 100% 100%  Weight:      Height:        Intake/Output Summary (Last 24 hours) at 05/04/2018 1043 Last data filed at 05/04/2018 1000 Gross per 24 hour  Intake 699.74 ml  Output 5055 ml  Net -4355.26 ml   Filed Weights   05/02/18 0500 05/03/18 0500 05/04/18 0213  Weight: 182  lb 5.1 oz (82.7 kg) 184 lb 1.4 oz (83.5 kg) 182 lb 12.2 oz (82.9 kg)    Physical Exam   General: Elderly, NAD Skin: Warm, dry, intact  Head: Normocephalic, atraumatic, clear, moist mucus membranes. Neck: Negative for carotid bruits. Difficult to assess JVD as central line in Left IJ, cannot see right side well. Lungs: Very rhonchorous today, diffuse crackles and wheezing Cardiovascular: RRR with normal S1 S2. No murmurs, rubs, gallops, or LV heave appreciated. Abdomen: Soft, non-tender, mildly distended with normoactive bowel sounds. MSK: squeezes fingers in response to touch Extremities:  1+ upper extremity edema with 1+ lower extremity edema. Neuro: intubated and sedated Psych: intubated and sedated  Labs    Chemistry Recent Labs  Lab 04/12/2018 1754  05/02/18 0400 05/03/18 0735 05/04/18 0508  NA 144   < > 144 144 144  K 4.1   < > 4.0 4.2 4.2  CL 113*   < > 103 103 102  CO2 22   < > 29 32 36*  GLUCOSE 124*   < > 193* 266* 117*  BUN 11   < > 29* 31* 37*  CREATININE 0.90   < > 0.75 0.70 0.68  CALCIUM 7.5*   < > 9.1 9.1 9.0  PROT 5.6*  --   --   --   --  ALBUMIN 3.1*  --   --   --   --   AST 88*  --   --   --   --   ALT 84*  --   --   --   --   ALKPHOS 54  --   --   --   --   BILITOT 0.3  --   --   --   --   GFRNONAA >60   < > >60 >60 >60  GFRAA >60   < > >60 >60 >60  ANIONGAP 9   < > '12 9 6   '$ < > = values in this interval not displayed.     Hematology Recent Labs  Lab 05/02/18 0400 05/03/18 0735 05/04/18 0508  WBC 11.2* 9.9 12.9*  RBC 3.63* 3.70* 3.69*  HGB 10.3* 10.5* 10.5*  HCT 34.2* 34.6* 35.0*  MCV 94.2 93.5 94.9  MCH 28.4 28.4 28.5  MCHC 30.1 30.3 30.0  RDW 13.3 13.5 13.7  PLT 231 219 248   Cardiac Enzymes Recent Labs  Lab 05/01/2018 1754 04/16/2018 2206 04/29/18 0437  TROPONINI 0.99* 0.64* 0.51*   No results for input(s): TROPIPOC in the last 168 hours.   BNPNo results for input(s): BNP, PROBNP in the last 168 hours.   DDimer No results for  input(s): DDIMER in the last 168 hours.   Radiology    Portable Chest X-ray  Result Date: 05/04/2018 CLINICAL DATA:  Endotracheal tube placement EXAM: PORTABLE CHEST 1 VIEW COMPARISON:  Earlier today FINDINGS: New endotracheal tube with tip 3 cm above the carina. Left IJ line with tip crossing the midline in the region of the distal left brachiocephalic. Hyperinflation and interstitial coarsening. Mild atelectatic type opacities at the bases. There is adenopathy with right perihilar mass by recent CT. IMPRESSION: 1. New endotracheal tube with tip 3 cm above the carina. 2. No change compared to admission chest CT. Electronically Signed   By: Monte Fantasia M.D.   On: 05/04/2018 07:47   Dg Chest Port 1 View  Result Date: 05/04/2018 CLINICAL DATA:  Respiratory distress. EXAM: PORTABLE CHEST 1 VIEW COMPARISON:  Radiograph May 03, 2018. FINDINGS: Endotracheal and nasogastric tubes have been removed. Left internal jugular catheter is unchanged in position. Stable cardiomediastinal silhouette. No pneumothorax is noted. Stable left midlung opacity is noted concerning for atelectasis or scarring. Stable right hilar mass is noted. Stable interstitial densities are noted in the right perihilar and basilar regions which may represent interstitial spread of carcinoma. Bony thorax is unremarkable. IMPRESSION: Endotracheal and nasogastric tubes have been removed. Otherwise no significant change compared to prior exam. Electronically Signed   By: Marijo Conception, M.D.   On: 05/04/2018 07:18   Dg Chest Port 1 View  Result Date: 05/03/2018 CLINICAL DATA:  Follow-up respiratory failure.  Ventilator support. EXAM: PORTABLE CHEST 1 VIEW COMPARISON:  05/01/2018 FINDINGS: Endotracheal tube tip remains 4 cm above the carina. Nasogastric tube enters the stomach. Left internal jugular central line tip at the SVC azygos level. None right hilar mass with hilar and mediastinal adenopathy in probable interstitial spread of carcinoma.  Persistent findings of COPD with additional mild volume loss in the lower lobes. No consolidation or lobar collapse. IMPRESSION: No significant change. Lines and tubes well positioned. Right hilar mass with interstitial spread of cancer, better shown at CT. Mild lower lung volume loss. Electronically Signed   By: Nelson Chimes M.D.   On: 05/03/2018 07:06   Telemetry    05/04/18 ST  -  Personally Reviewed  ECG    No new tracings   Cardiac Studies   Echocardiogram 05/01/18: Study Conclusions  - Left ventricle: The cavity size was normal. Wall thickness was normal. Systolic function was mildly reduced. The estimated ejection fraction was in the range of 45% to 50%. Diffuse hypokinesis. Doppler parameters are consistent with abnormal left ventricular relaxation (grade 1 diastolic dysfunction). - Mitral valve: There was mild regurgitation. - Left atrium: The atrium was moderately dilated. - Pulmonary arteries: PA peak pressure: 36 mm Hg (S). - Pericardium, extracardiac: A small pericardial effusion was identified.  Recommendations: Mild global reduction in LV systolic function; mild diastolic dysfunction; mild MR; moderate LAE; cannot R/O mass compressing right atrium on subcostal images; suggest cardiac CTA to further assess; small pericardial effusion. Negative saline microcavitation study.  Patient Profile     72 y.o. female with a hx of COPD with chronic respiratory failure on home oxygen, lung cancer status post radiation and chemo, hypertension, hyperlipidemiawho is being followed by Cardiology for the evaluation ofCHFat the request of Dr. Nelda Marseille.  Assessment & Plan    1. SEMI (subendocardial MI): -Pt admitted with acute repsiratory failure, transferred from [facility] and found to have an elevated trop of 0.66, trended down to 0.51 -Likely demand ischemia in the setting of acute respiratory failure -Echo with LVEF of 45-50% with diffuse hypokinesis and  G1DD -Continue statin  2. Acute systolic and diastolic heart failure: -Per echocardiogram with LVEF of 45% to 50% with diffuse hypokinesis and grade 1 diastolic dysfunction performed 05/01/18. BNP on admission 544. -Bubble study negative for R>L shunting  -weight decreasing and net negative, but mild increase in swelling. Creatinine stable. Would monitor closely, consider giving diuretic if trending toward even or net positive on ins/outs. Ideally would keep at least 542m-1L negative daily.  3. Acute respiratory failure: -Hx of chemo/radiation for lung CA CT chest showed hilar mass with b/l pulmonary artery involvement and possible lymphangitic spread -reintubated this morning. Per husband, considering trach -management per PCCM  4. HTN: -depends on sedation, running borderline low currently, higher when extubated. Will continue to monitor.  5. Tobacco use: -Nicotine patch in place   6. COPD: -Per primary team  BBuford Dresser MD, PhD [facility]   For questions or updates, please contact   Please consult [email or web address] for contact info under Cardiology/STEMI.

## 2018-05-04 NOTE — Progress Notes (Signed)
PULMONARY / CRITICAL CARE MEDICINE   Name: Dawn Howe MRN: [id number] DOB: 1945-12-04    ADMISSION DATE:  04/30/2018  REFERRING MD:  Oval Linsey  CHIEF COMPLAINT:  Short of breath  HISTORY OF PRESENT ILLNESS:   72 yo female smoker travelling from [place] presented to [facility] hospital with dyspnea, and near syncope.  Required intubation for hypoxia/hypercapnia.  CT chest showed hilar mass with b/l pulmonary artery involvement and possible lymphangitic spread.  Echo showed systolic dysfunction.  PAST MEDICAL HISTORY :  Lung cancer s/p chemoradiation, COPD on 2 liters, CAD, CHF, HTN.  SUBJECTIVE:  Had respiratory distress, wheezing, hypoxia overnight.  Didn't improve with Bipap, and required reintubation.  VITAL SIGNS: BP 90/61   Pulse 92   Temp 99.1 F (37.3 C)   Resp 18   Ht 5\' 4"  (1.626 m)   Wt 182 lb 12.2 oz (82.9 kg)   SpO2 100%   BMI 31.37 kg/m   HEMODYNAMICS: CVP:  [12 mmHg-14 mmHg] 12 mmHg  VENTILATOR SETTINGS: Vent Mode: PRVC FiO2 (%):  [40 %-60 %] 60 % Set Rate:  [18 bmp] 18 bmp Vt Set:  [500 mL] 500 mL PEEP:  [5 cmH20] 5 cmH20 Pressure Support:  [18 cmH20] 18 cmH20  INTAKE / OUTPUT: I/O last 3 completed shifts: In: 2058.8 [I.V.:719.7; NG/GT:705; IV Piggyback:634.1] Out: 1027 [Urine:4655]  PHYSICAL EXAMINATION:  General - sedated Eyes - pupils reactive ENT - ETT in place Cardiac - regular, no murmur Chest - b/l wheeze Abd - soft, non tender Ext - no edema Skin - no rashes Neuro - RASS -1   LABS:  BMET Recent Labs  Lab 05/02/18 0400 05/03/18 0735 05/04/18 0508  NA 144 144 144  K 4.0 4.2 4.2  CL 103 103 102  CO2 29 32 36*  BUN 29* 31* 37*  CREATININE 0.75 0.70 0.68  GLUCOSE 193* 266* 117*    Electrolytes Recent Labs  Lab 04/29/18 1705 04/30/18 0444 04/30/18 1702  05/02/18 0400 05/03/18 0735 05/04/18 0508  CALCIUM  --   --   --    < > 9.1 9.1 9.0  MG 1.8 1.9 2.0  --   --   --   --   PHOS 1.9* 2.1* 2.4*  --   --   --   --     < > = values in this interval not displayed.    CBC Recent Labs  Lab 05/02/18 0400 05/03/18 0735 05/04/18 0508  WBC 11.2* 9.9 12.9*  HGB 10.3* 10.5* 10.5*  HCT 34.2* 34.6* 35.0*  PLT 231 219 248    Coag's Recent Labs  Lab 04/25/2018 1754  INR 1.15    Sepsis Markers Recent Labs  Lab 04/16/2018 1750 04/29/2018 1754 04/10/2018 2207  LATICACIDVEN 1.3  --  1.8  PROCALCITON  --  1.72  --     ABG Recent Labs  Lab 05/01/18 0910 05/02/18 0404 05/04/18 0657  PHART 7.353 7.394 7.183*  PCO2ART 49.6* 54.1* 93.1*  PO2ART 117.0* 114.0* 104    Liver Enzymes Recent Labs  Lab 05/01/2018 1754  AST 88*  ALT 84*  ALKPHOS 54  BILITOT 0.3  ALBUMIN 3.1*    Cardiac Enzymes Recent Labs  Lab 04/12/2018 1754 04/16/2018 2206 04/29/18 0437  TROPONINI 0.99* 0.64* 0.51*    Glucose Recent Labs  Lab 05/03/18 1203 05/03/18 1548 05/03/18 2040 05/03/18 2329 05/04/18 0327 05/04/18 0753  GLUCAP 145* 95 103* 123* 103* 188*    Imaging Portable Chest X-ray  Result Date: 05/04/2018 CLINICAL DATA:  Endotracheal tube placement EXAM: PORTABLE CHEST 1 VIEW COMPARISON:  Earlier today FINDINGS: New endotracheal tube with tip 3 cm above the carina. Left IJ line with tip crossing the midline in the region of the distal left brachiocephalic. Hyperinflation and interstitial coarsening. Mild atelectatic type opacities at the bases. There is adenopathy with right perihilar mass by recent CT. IMPRESSION: 1. New endotracheal tube with tip 3 cm above the carina. 2. No change compared to admission chest CT. Electronically Signed   By: Monte Fantasia M.D.   On: 05/04/2018 07:47   Dg Chest Port 1 View  Result Date: 05/04/2018 CLINICAL DATA:  Respiratory distress. EXAM: PORTABLE CHEST 1 VIEW COMPARISON:  Radiograph May 03, 2018. FINDINGS: Endotracheal and nasogastric tubes have been removed. Left internal jugular catheter is unchanged in position. Stable cardiomediastinal silhouette. No pneumothorax is  noted. Stable left midlung opacity is noted concerning for atelectasis or scarring. Stable right hilar mass is noted. Stable interstitial densities are noted in the right perihilar and basilar regions which may represent interstitial spread of carcinoma. Bony thorax is unremarkable. IMPRESSION: Endotracheal and nasogastric tubes have been removed. Otherwise no significant change compared to prior exam. Electronically Signed   By: Marijo Conception, M.D.   On: 05/04/2018 07:18     STUDIES:  CT angio chest 6/27 [facility]) >> tumor encasement or b/l PA, b/l hilar masses, centrilobular emphysema, septal thickening, scattered nodules Echo 6/30 >> EF 45 to 50%, grade 1 DD, mod LA dilation, mild MR, PAS 36 mmHg  CULTURES: Urine 6/27 >> negative Sputum 6/27 >> Pseudomonas aeruginosa Blood 6/27 >> negative  ANTIBIOTICS: Zithromax 6/27 >> 6/28 Rocephin 6/27 >> 6/28 Fortaz 6/29 >>  SIGNIFICANT EVENTS: 6/27 transfer from [place], cardiology consulted 6/30 self extubated >> stridor >> reintubated, start decadron 7/02 extubated 7/03 wheeze, hypoxia, AMS >> reintubated  LINES/TUBES: ETT 6/27 >> 6/30 (self extubation) Lt IJ CVL 6/27 >> Rt radial aline 6/27 >> 7/01 ETT 6/30 >> 7/02 ETT 7/03 >>   DISCUSSION: 72 yo female smoker with acute on chronic hypoxic/hypercapnic respiratory failure from Pseudomonal pneumonia in setting of COPD with emphysema, lung cancer, systolic CHF.  Self extubated 6/30 with post extubation stridor and reintubated.  Extubated on 7/02.  Developed hypoxia/hypercapnia, wheezing, AMS 7/03 and reintubated.  ASSESSMENT / PLAN:  Acute on chronic respiratory failure with hypoxia, hypercapnia. COPD exacerbation. COPD with emphysema. Post extubation stridor. - full vent support - solumedrol 40 mg q6h - scheduled duoneb - f/u CXR - adjust oxygen to keep SpO2 90 to 95%  Pseudomonal pneumonia. - day 5/7 of fortaz  Hx of lung cancer. - husband reports that her  cancer doctors in [place] said her cancer was controlled - imaging studies here show persistent findings from lung cancer - will contact her oncologist, Dr. Shelton Silvas, in [place], [place] [phone]   Acute systolic CHF. NSTEMI Type II. Hx of HLD. 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 - SSI  Acute metabolic encephalopathy. Hx of depression, anxiety. - RASS goal 0 to -1 - buspar, remeron, paxil  DVT prophylaxis - lovenox SUP - protonix Nutrition - tube feeds Goals of care - full code  Updated husband at bedside  CC time 42 minutes  Chesley Mires, MD [place] 05/04/2018, 9:29 AM

## 2018-05-04 NOTE — Progress Notes (Signed)
Pt sees a DR Hannah Beat for her Cancer follow up He is located in [place], [place]  [phone] in the [facility]

## 2018-05-04 NOTE — Progress Notes (Signed)
SLP Cancellation Note  Patient Details Name: Dawn Howe MRN: [id number] DOB: 02-04-1946   Cancelled treatment:       Reason Eval/Treat Not Completed: Medical issues which prohibited therapy. Orders received for swallow evaluation but pt has since been reintubated. Please reorder SLP when ready.   Germain Osgood 05/04/2018, 8:11 AM  Germain Osgood, M.A. CCC-SLP [phone]

## 2018-05-05 ENCOUNTER — Inpatient Hospital Stay (HOSPITAL_COMMUNITY): Payer: Medicare Other

## 2018-05-05 DIAGNOSIS — E876 Hypokalemia: Secondary | ICD-10-CM | POA: Diagnosis not present

## 2018-05-05 DIAGNOSIS — J810 Acute pulmonary edema: Secondary | ICD-10-CM

## 2018-05-05 LAB — GLUCOSE, CAPILLARY
GLUCOSE-CAPILLARY: 260 mg/dL — AB (ref 70–99)
GLUCOSE-CAPILLARY: 88 mg/dL (ref 70–99)
Glucose-Capillary: 192 mg/dL — ABNORMAL HIGH (ref 70–99)
Glucose-Capillary: 245 mg/dL — ABNORMAL HIGH (ref 70–99)
Glucose-Capillary: 283 mg/dL — ABNORMAL HIGH (ref 70–99)
Glucose-Capillary: 292 mg/dL — ABNORMAL HIGH (ref 70–99)

## 2018-05-05 LAB — MAGNESIUM
Magnesium: 2.2 mg/dL (ref 1.7–2.4)
Magnesium: 2.1 mg/dL (ref 1.7–2.4)

## 2018-05-05 LAB — BASIC METABOLIC PANEL
ANION GAP: 4 — AB (ref 5–15)
BUN: 37 mg/dL — AB (ref 8–23)
CALCIUM: 8.4 mg/dL — AB (ref 8.9–10.3)
CO2: 35 mmol/L — AB (ref 22–32)
Chloride: 104 mmol/L (ref 98–111)
Creatinine, Ser: 0.66 mg/dL (ref 0.44–1.00)
GFR calc Af Amer: >60 mL/min (ref >60)
GFR calc non Af Amer: >60 mL/min (ref >60)
GLUCOSE: 244 mg/dL — AB (ref 70–99)
Potassium: 3.8 mmol/L (ref 3.5–5.1)
Sodium: 143 mmol/L (ref 135–145)

## 2018-05-05 LAB — PHOSPHORUS
Phosphorus: 3.3 mg/dL (ref 2.5–4.6)
Phosphorus: 3.4 mg/dL (ref 2.5–4.6)

## 2018-05-05 LAB — CBC
HEMATOCRIT: 34.9 % — AB (ref 36.0–46.0)
HEMOGLOBIN: 10.6 g/dL — AB (ref 12.0–15.0)
MCH: 28.3 pg (ref 26.0–34.0)
MCHC: 30.4 g/dL (ref 30.0–36.0)
MCV: 93.1 fL (ref 78.0–100.0)
Platelets: 223 10*3/uL (ref 150–400)
RBC: 3.75 MIL/uL — ABNORMAL LOW (ref 3.87–5.11)
RDW: 13.3 % (ref 11.5–15.5)
WBC: 12.5 10*3/uL — ABNORMAL HIGH (ref 4.0–10.5)

## 2018-05-05 MED ORDER — POTASSIUM CHLORIDE 10 MEQ/50ML IV SOLN
10.0000 meq | INTRAVENOUS | Status: AC
  Administered 2018-05-05 (×4): 10 meq via INTRAVENOUS
  Filled 2018-05-05 (×4): qty 50

## 2018-05-05 MED ORDER — FUROSEMIDE 10 MG/ML IJ SOLN
40.0000 mg | Freq: Three times a day (TID) | INTRAMUSCULAR | Status: AC
  Administered 2018-05-05 (×2): 40 mg via INTRAVENOUS
  Filled 2018-05-05: qty 4

## 2018-05-05 NOTE — Progress Notes (Signed)
Progress Note  Patient Name: Dawn Howe Date of Encounter: 05/05/2018  Primary Cardiologist: Dr. Johnsie Cancel   Subjective   Patient intubated but alert this AM, denies any pain. Husband says they are waiting to see if she tolerates extubation.  Inpatient Medications    Scheduled Meds: . atorvastatin  20 mg Per Tube q1800  . busPIRone  15 mg Per Tube Daily  . chlorhexidine gluconate (MEDLINE KIT)  15 mL Mouth Rinse BID  . enoxaparin (LOVENOX) injection  40 mg Subcutaneous Q24H  . feeding supplement (PRO-STAT SUGAR FREE 64)  30 mL Per Tube Daily  . feeding supplement (VITAL HIGH PROTEIN)  1,000 mL Per Tube Q24H  . furosemide  40 mg Intravenous Once  . insulin aspart  0-15 Units Subcutaneous Q4H  . ipratropium-albuterol  3 mL Nebulization Q6H  . mouth rinse  15 mL Mouth Rinse 10 times per day  . methylPREDNISolone (SOLU-MEDROL) injection  40 mg Intravenous Q6H  . mirtazapine  30 mg Per Tube QHS  . pantoprazole sodium  40 mg Per Tube Q24H  . PARoxetine  40 mg Per Tube Daily   Continuous Infusions: . sodium chloride Stopped (04/30/18 1616)  . cefTAZidime (FORTAZ)  IV 1 g (05/05/18 0230)  . dexmedetomidine (PRECEDEX) IV infusion 0.8 mcg/kg/hr (05/05/18 0826)   PRN Meds: sodium chloride, acetaminophen (TYLENOL) oral liquid 160 mg/5 mL, albuterol, fentaNYL (SUBLIMAZE) injection, polyethylene glycol, senna-docusate   Vital Signs    Vitals:   05/05/18 0730 05/05/18 0746 05/05/18 0800 05/05/18 0830  BP: (!) 148/85 (!) 148/85 138/78 123/77  Pulse: 72 75 78 87  Resp: '14 20 15 16  '$ Temp: 98.2 F (36.8 C)  98.2 F (36.8 C) 98.2 F (36.8 C)  TempSrc:      SpO2: 100% 100% 100% 93%  Weight:      Height:        Intake/Output Summary (Last 24 hours) at 05/05/2018 0853 Last data filed at 05/05/2018 0800 Gross per 24 hour  Intake 1426.17 ml  Output 2295 ml  Net -868.83 ml   Filed Weights   05/03/18 0500 05/04/18 0213 05/05/18 0230  Weight: 184 lb 1.4 oz (83.5 kg) 182 lb  12.2 oz (82.9 kg) 182 lb 12.2 oz (82.9 kg)    Physical Exam   General: Elderly, NAD Skin: Warm, dry, intact  Head: Normocephalic, atraumatic, clear, moist mucus membranes. Neck: Negative for carotid bruits. Difficult to assess JVD as central line in Left IJ, cannot see right side well. Lungs: Improved breath sounds today, rales at expiration but less rhoncourous, no wheezing Cardiovascular: RRR with normal S1 S2. No murmurs, rubs, gallops, or LV heave appreciated. Abdomen: Soft, non-tender, mildly distended with normoactive bowel sounds. MSK: squeezes fingers in response to touch Extremities:  Improved upper extremity edema with trace lower extremity edema. Neuro: eyes open, nods head and moves extremities to command Psych: intubated but calm, alert  Labs    Chemistry Recent Labs  Lab 04/12/2018 1754  05/03/18 0735 05/04/18 0508 05/05/18 0419  NA 144   < > 144 144 143  K 4.1   < > 4.2 4.2 3.8  CL 113*   < > 103 102 104  CO2 22   < > 32 36* 35*  GLUCOSE 124*   < > 266* 117* 244*  BUN 11   < > 31* 37* 37*  CREATININE 0.90   < > 0.70 0.68 0.66  CALCIUM 7.5*   < > 9.1 9.0 8.4*  PROT 5.6*  --   --   --   --  ALBUMIN 3.1*  --   --   --   --   AST 88*  --   --   --   --   ALT 84*  --   --   --   --   ALKPHOS 54  --   --   --   --   BILITOT 0.3  --   --   --   --   GFRNONAA >60   < > >60 >60 >60  GFRAA >60   < > >60 >60 >60  ANIONGAP 9   < > 9 6 4*   < > = values in this interval not displayed.     Hematology Recent Labs  Lab 05/03/18 0735 05/04/18 0508 05/05/18 0419  WBC 9.9 12.9* 12.5*  RBC 3.70* 3.69* 3.75*  HGB 10.5* 10.5* 10.6*  HCT 34.6* 35.0* 34.9*  MCV 93.5 94.9 93.1  MCH 28.4 28.5 28.3  MCHC 30.3 30.0 30.4  RDW 13.5 13.7 13.3  PLT 219 248 223   Cardiac Enzymes Recent Labs  Lab 04/25/2018 1754 04/27/2018 2206 04/29/18 0437  TROPONINI 0.99* 0.64* 0.51*   No results for input(s): TROPIPOC in the last 168 hours.   BNPNo results for input(s): BNP, PROBNP  in the last 168 hours.   DDimer No results for input(s): DDIMER in the last 168 hours.   Radiology    Dg Chest Port 1 View  Result Date: 05/05/2018 CLINICAL DATA:  Respiratory failure EXAM: PORTABLE CHEST 1 VIEW COMPARISON:  05/04/2018 FINDINGS: Endotracheal tube, nasogastric catheter and left jugular central line are noted in satisfactory position. The cardiac shadow is stable. The lungs are well aerated bilaterally. Mild midlung atelectatic changes are noted on the left stable from the prior exam but previously obscured by overlying wires. No sizable effusion is seen. Mild interstitial coarsening is noted. IMPRESSION: Stable appearance of the chest when compared with the previous day. Electronically Signed   By: Inez Catalina M.D.   On: 05/05/2018 07:00   Portable Chest X-ray  Result Date: 05/04/2018 CLINICAL DATA:  Endotracheal tube placement EXAM: PORTABLE CHEST 1 VIEW COMPARISON:  Earlier today FINDINGS: New endotracheal tube with tip 3 cm above the carina. Left IJ line with tip crossing the midline in the region of the distal left brachiocephalic. Hyperinflation and interstitial coarsening. Mild atelectatic type opacities at the bases. There is adenopathy with right perihilar mass by recent CT. IMPRESSION: 1. New endotracheal tube with tip 3 cm above the carina. 2. No change compared to admission chest CT. Electronically Signed   By: Monte Fantasia M.D.   On: 05/04/2018 07:47   Dg Chest Port 1 View  Result Date: 05/04/2018 CLINICAL DATA:  Respiratory distress. EXAM: PORTABLE CHEST 1 VIEW COMPARISON:  Radiograph May 03, 2018. FINDINGS: Endotracheal and nasogastric tubes have been removed. Left internal jugular catheter is unchanged in position. Stable cardiomediastinal silhouette. No pneumothorax is noted. Stable left midlung opacity is noted concerning for atelectasis or scarring. Stable right hilar mass is noted. Stable interstitial densities are noted in the right perihilar and basilar regions  which may represent interstitial spread of carcinoma. Bony thorax is unremarkable. IMPRESSION: Endotracheal and nasogastric tubes have been removed. Otherwise no significant change compared to prior exam. Electronically Signed   By: Marijo Conception, M.D.   On: 05/04/2018 07:18   Dg Abd Portable 1v  Result Date: 05/04/2018 CLINICAL DATA:  Check gastric catheter placement EXAM: PORTABLE ABDOMEN - 1 VIEW COMPARISON:  None. FINDINGS: Scattered large  and small bowel gas is noted. A gastric catheter is noted coiled within the stomach. IMPRESSION: Gastric catheter coiled within the stomach. Electronically Signed   By: Inez Catalina M.D.   On: 05/04/2018 11:44   Dg Abd Portable 1v  Result Date: 05/04/2018 CLINICAL DATA:  Orogastric tube placement EXAM: PORTABLE ABDOMEN - 1 VIEW COMPARISON:  None. FINDINGS: Orogastric tube with the tip projecting over the stomach. There is no bowel dilatation to suggest obstruction. There is no evidence of pneumoperitoneum, portal venous gas or pneumatosis. There are no pathologic calcifications along the expected course of the ureters. The osseous structures are unremarkable. IMPRESSION: Orogastric tube with the tip projecting over the stomach. Electronically Signed   By: Kathreen Devoid   On: 05/04/2018 11:43   Telemetry    05/05/18 ST and NSR - Personally Reviewed  ECG    No new tracings   Cardiac Studies   Echocardiogram 05/01/18: Study Conclusions  - Left ventricle: The cavity size was normal. Wall thickness was normal. Systolic function was mildly reduced. The estimated ejection fraction was in the range of 45% to 50%. Diffuse hypokinesis. Doppler parameters are consistent with abnormal left ventricular relaxation (grade 1 diastolic dysfunction). - Mitral valve: There was mild regurgitation. - Left atrium: The atrium was moderately dilated. - Pulmonary arteries: PA peak pressure: 36 mm Hg (S). - Pericardium, extracardiac: A small pericardial effusion  was identified.  Recommendations: Mild global reduction in LV systolic function; mild diastolic dysfunction; mild MR; moderate LAE; cannot R/O mass compressing right atrium on subcostal images; suggest cardiac CTA to further assess; small pericardial effusion. Negative saline microcavitation study.  Patient Profile     72 y.o. female with a hx of COPD with chronic respiratory failure on home oxygen, lung cancer status post radiation and chemo, hypertension, hyperlipidemiawho is being followed by Cardiology for the evaluation ofCHFat the request of Dr. Nelda Marseille.  Assessment & Plan    1. SEMI (subendocardial MI): -Pt admitted with acute respiratory failure, transferred from [facility] and found to have an elevated trop of 0.66, trended down to 0.51 -Likely demand ischemia in the setting of acute respiratory failure -Echo with LVEF of 45-50% with diffuse hypokinesis and G1DD -Continue statin -once stable from a respiratory standpoint, can consider angiography given initial presentation was concerning for ACS vs. takotsubo's. Will need to be a discussion based on results of lung workup (ie prognosis, is she a candidate for DAPT).  2. Acute systolic and diastolic heart failure: -Per echocardiogram with LVEF of 45% to 50% with diffuse hypokinesis and grade 1 diastolic dysfunction performed 05/01/18. BNP on admission 544. -Bubble study negative for R>L shunting  -net negative yesterday, improvement in lung sounds and edema today. Recommend PRN diuresis, creatinine stable and diureses well with small dose of lasix.  3. Acute respiratory failure: -Hx of chemo/radiation for lung CA CT chest showed hilar mass with b/l pulmonary artery involvement and possible lymphangitic spread -currently intubated, per husband will try to extubate one more time before discussing trach -management per PCCM  4. HTN: -hypertensive when off sedation. Was not on home BP meds per the chart. Will  continue to monitor  5. Tobacco use: will continue to counsel   6. COPD: -Per primary team  Buford Dresser, MD, PhD [facility]  [facility]   For questions or updates, please contact   Please consult [email or web address] for contact info under Cardiology/STEMI.

## 2018-05-05 NOTE — Progress Notes (Signed)
PT does not have a cuff leak and MD aware. Order for extubation discontinued at this time.

## 2018-05-05 NOTE — Progress Notes (Signed)
PULMONARY / CRITICAL CARE MEDICINE   Name: Dawn Howe MRN: [id number] DOB: 09/09/1946    ADMISSION DATE:  04/21/2018  REFERRING MD:  Oval Linsey  CHIEF COMPLAINT:  Short of breath  HISTORY OF PRESENT ILLNESS:   72 yo female smoker travelling from [place] presented to [facility] hospital with dyspnea, and near syncope.  Required intubation for hypoxia/hypercapnia.  CT chest showed hilar mass with b/l pulmonary artery involvement and possible lymphangitic spread.  Echo showed systolic dysfunction.  PAST MEDICAL HISTORY :  No events overnight, intubated   SUBJECTIVE:  Had respiratory distress, wheezing, hypoxia overnight.  Didn't improve with Bipap, and required reintubation.  VITAL SIGNS: BP 123/77   Pulse 87   Temp 98.2 F (36.8 C)   Resp 16   Ht 5\' 4"  (1.626 m)   Wt 182 lb 12.2 oz (82.9 kg)   SpO2 93%   BMI 31.37 kg/m   HEMODYNAMICS: CVP:  [10 mmHg-13 mmHg] 10 mmHg  VENTILATOR SETTINGS: Vent Mode: PRVC FiO2 (%):  [40 %] 40 % Set Rate:  [16 bmp] 16 bmp Vt Set:  [440 mL] 440 mL PEEP:  [5 cmH20] 5 cmH20 Plateau Pressure:  [20 cmH20-29 cmH20] 29 cmH20  INTAKE / OUTPUT: I/O last 3 completed shifts: In: 1647.8 [I.V.:657.8; NG/GT:540; IV Piggyback:450] Out: 3627.5 [Urine:3627.5]  PHYSICAL EXAMINATION:  General - Alert and interactive Eyes - PERRL, EOM-I ENT - ETT is in place Cardiac - RRR, Nl S1/S2 and -M/R/G Chest - Decreased BS diffusely Abd - Soft, NT, ND and +BS Ext - -edema and -tenderness Skin - no rashes Neuro - Alert and interactive, moving all ext to command  LABS:  BMET Recent Labs  Lab 05/03/18 0735 05/04/18 0508 05/05/18 0419  NA 144 144 143  K 4.2 4.2 3.8  CL 103 102 104  CO2 32 36* 35*  BUN 31* 37* 37*  CREATININE 0.70 0.68 0.66  GLUCOSE 266* 117* 244*   Electrolytes Recent Labs  Lab 05/03/18 0735  05/04/18 0508 05/04/18 1505 05/04/18 2107 05/05/18 0419  CALCIUM 9.1  --  9.0  --   --  8.4*  MG  --    < >  --  2.2 2.3 2.2   PHOS  --    < >  --  4.0 3.9 3.3   < > = values in this interval not displayed.   CBC Recent Labs  Lab 05/03/18 0735 05/04/18 0508 05/05/18 0419  WBC 9.9 12.9* 12.5*  HGB 10.5* 10.5* 10.6*  HCT 34.6* 35.0* 34.9*  PLT 219 248 223   Coag's Recent Labs  Lab 04/30/2018 1754  INR 1.15   Sepsis Markers Recent Labs  Lab 04/04/2018 1750 04/20/2018 1754 04/09/2018 2207  LATICACIDVEN 1.3  --  1.8  PROCALCITON  --  1.72  --    ABG Recent Labs  Lab 05/02/18 0404 05/04/18 0657 05/04/18 0930  PHART 7.394 7.183* 7.450  PCO2ART 54.1* 93.1* 47.8  PO2ART 114.0* 104 162*   Liver Enzymes Recent Labs  Lab 04/18/2018 1754  AST 88*  ALT 84*  ALKPHOS 54  BILITOT 0.3  ALBUMIN 3.1*   Cardiac Enzymes Recent Labs  Lab 04/24/2018 1754 04/21/2018 2206 04/29/18 0437  TROPONINI 0.99* 0.64* 0.51*   Glucose Recent Labs  Lab 05/04/18 1152 05/04/18 1558 05/04/18 1922 05/04/18 2355 05/05/18 0406 05/05/18 0740  GLUCAP 160* 235* 243* 245* 260* 292*   Imaging Dg Chest Port 1 View  Result Date: 05/05/2018 CLINICAL DATA:  Respiratory failure EXAM: PORTABLE CHEST 1 VIEW COMPARISON:  05/04/2018 FINDINGS: Endotracheal tube, nasogastric catheter and left jugular central line are noted in satisfactory position. The cardiac shadow is stable. The lungs are well aerated bilaterally. Mild midlung atelectatic changes are noted on the left stable from the prior exam but previously obscured by overlying wires. No sizable effusion is seen. Mild interstitial coarsening is noted. IMPRESSION: Stable appearance of the chest when compared with the previous day. Electronically Signed   By: Inez Catalina M.D.   On: 05/05/2018 07:00   Dg Abd Portable 1v  Result Date: 05/04/2018 CLINICAL DATA:  Check gastric catheter placement EXAM: PORTABLE ABDOMEN - 1 VIEW COMPARISON:  None. FINDINGS: Scattered large and small bowel gas is noted. A gastric catheter is noted coiled within the stomach. IMPRESSION: Gastric catheter  coiled within the stomach. Electronically Signed   By: Inez Catalina M.D.   On: 05/04/2018 11:44   Dg Abd Portable 1v  Result Date: 05/04/2018 CLINICAL DATA:  Orogastric tube placement EXAM: PORTABLE ABDOMEN - 1 VIEW COMPARISON:  None. FINDINGS: Orogastric tube with the tip projecting over the stomach. There is no bowel dilatation to suggest obstruction. There is no evidence of pneumoperitoneum, portal venous gas or pneumatosis. There are no pathologic calcifications along the expected course of the ureters. The osseous structures are unremarkable. IMPRESSION: Orogastric tube with the tip projecting over the stomach. Electronically Signed   By: Kathreen Devoid   On: 05/04/2018 11:43   STUDIES:  CT angio chest 6/27 [facility]) >> tumor encasement or b/l PA, b/l hilar masses, centrilobular emphysema, septal thickening, scattered nodules Echo 6/30 >> EF 45 to 50%, grade 1 DD, mod LA dilation, mild MR, PAS 36 mmHg  CULTURES: Urine 6/27 >> negative Sputum 6/27 >> Pseudomonas aeruginosa Blood 6/27 >> negative  ANTIBIOTICS: Zithromax 6/27 >> 6/28 Rocephin 6/27 >> 6/28 Fortaz 6/29 >>  SIGNIFICANT EVENTS: 6/27 transfer from [place], cardiology consulted 6/30 self extubated >> stridor >> reintubated, start decadron 7/02 extubated 7/03 wheeze, hypoxia, AMS >> reintubated  LINES/TUBES: ETT 6/27 >> 6/30 (self extubation) Lt IJ CVL 6/27 >> Rt radial aline 6/27 >> 7/01 ETT 6/30 >> 7/02 ETT 7/03 >>   DISCUSSION: 72 yo female smoker with acute on chronic hypoxic/hypercapnic respiratory failure from Pseudomonal pneumonia in setting of COPD with emphysema, lung cancer, systolic CHF.  Self extubated 6/30 with post extubation stridor and reintubated.  Extubated on 7/02.  Developed hypoxia/hypercapnia, wheezing, AMS 7/03 and reintubated.  ASSESSMENT / PLAN:  Acute on chronic respiratory failure with hypoxia, hypercapnia. COPD exacerbation. COPD with emphysema. Post extubation stridor. - Wean  to extubate today - Continue solumedrol 40 mg q6h - Scheduled duonebs - CXR to PRN at this point - Titrate O2 for sat of 88-92% - IS and flutter valve - Ambulate - SLP  Pseudomonal pneumonia. - Day 6/7 of fortaz  Hx of lung cancer. - Husband reports that her cancer doctors in [place] said her cancer was controlled, note from Dr. Juanetta Gosling conversation noted, no bronch for now - Imaging studies here show persistent findings from lung cancer  Acute systolic CHF. NSTEMI Type II. Hx of HLD. 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 - SSI  Acute metabolic encephalopathy. Hx of depression, anxiety. - RASS goal 0 to -1 - Buspar, remeron, paxil  DVT prophylaxis - lovenox SUP - protonix Nutrition - tube feeds Goals of care - full code  Updated husband at bedside  The patient is critically  ill with multiple organ systems failure and requires high complexity decision making for assessment and support, frequent evaluation and titration of therapies, application of advanced monitoring technologies and extensive interpretation of multiple databases.   Critical Care Time devoted to patient care services described in this note is  34  Minutes. This time reflects time of care of this signee Dr Jennet Maduro. This critical care time does not reflect procedure time, or teaching time or supervisory time of PA/NP/Med student/Med Resident etc but could involve care discussion time.  Rush Farmer, M.D. [facility] Pulmonary/Critical Care Medicine. Pager: [phone]. After hours pager: [phone].  05/05/2018, 9:07 AM

## 2018-05-06 ENCOUNTER — Inpatient Hospital Stay (HOSPITAL_COMMUNITY): Payer: Medicare Other

## 2018-05-06 LAB — BASIC METABOLIC PANEL
Anion gap: 12 (ref 5–15)
BUN: 40 mg/dL — ABNORMAL HIGH (ref 8–23)
CALCIUM: 9.2 mg/dL (ref 8.9–10.3)
CO2: 36 mmol/L — AB (ref 22–32)
CREATININE: 0.96 mg/dL (ref 0.44–1.00)
Chloride: 97 mmol/L — ABNORMAL LOW (ref 98–111)
GFR, EST NON AFRICAN AMERICAN: 58 mL/min — AB (ref >60)
Glucose, Bld: 249 mg/dL — ABNORMAL HIGH (ref 70–99)
POTASSIUM: 3.7 mmol/L (ref 3.5–5.1)
SODIUM: 145 mmol/L (ref 135–145)

## 2018-05-06 LAB — BLOOD GAS, ARTERIAL
Acid-Base Excess: 5.5 mmol/L — ABNORMAL HIGH (ref 0.0–2.0)
BICARBONATE: 33.3 mmol/L — AB (ref 20.0–28.0)
DRAWN BY: 23703
Expiratory PAP: 5
FIO2: 0.45
Inspiratory PAP: 12
O2 Saturation: 96.9 %
PATIENT TEMPERATURE: 98.6
PO2 ART: 131 mmHg — AB (ref 83.0–108.0)
pCO2 arterial: 90.5 mmHg (ref 32.0–48.0)
pH, Arterial: 7.192 — CL (ref 7.350–7.450)

## 2018-05-06 LAB — GLUCOSE, CAPILLARY
GLUCOSE-CAPILLARY: 183 mg/dL — AB (ref 70–99)
GLUCOSE-CAPILLARY: 213 mg/dL — AB (ref 70–99)
GLUCOSE-CAPILLARY: 257 mg/dL — AB (ref 70–99)
GLUCOSE-CAPILLARY: 277 mg/dL — AB (ref 70–99)
Glucose-Capillary: 102 mg/dL — ABNORMAL HIGH (ref 70–99)
Glucose-Capillary: 155 mg/dL — ABNORMAL HIGH (ref 70–99)

## 2018-05-06 LAB — CBC
HEMATOCRIT: 39.6 % (ref 36.0–46.0)
Hemoglobin: 11.9 g/dL — ABNORMAL LOW (ref 12.0–15.0)
MCH: 28.3 pg (ref 26.0–34.0)
MCHC: 30.1 g/dL (ref 30.0–36.0)
MCV: 94.1 fL (ref 78.0–100.0)
Platelets: 287 10*3/uL (ref 150–400)
RBC: 4.21 MIL/uL (ref 3.87–5.11)
RDW: 13.8 % (ref 11.5–15.5)
WBC: 22 10*3/uL — ABNORMAL HIGH (ref 4.0–10.5)

## 2018-05-06 LAB — PHOSPHORUS: PHOSPHORUS: 3.6 mg/dL (ref 2.5–4.6)

## 2018-05-06 LAB — POCT I-STAT 3, ART BLOOD GAS (G3+)
Acid-Base Excess: 11 mmol/L — ABNORMAL HIGH (ref 0.0–2.0)
BICARBONATE: 39 mmol/L — AB (ref 20.0–28.0)
O2 Saturation: 99 %
PCO2 ART: 64 mmHg — AB (ref 32.0–48.0)
PH ART: 7.391 (ref 7.350–7.450)
PO2 ART: 120 mmHg — AB (ref 83.0–108.0)
Patient temperature: 97.8
TCO2: 41 mmol/L — AB (ref 22–32)

## 2018-05-06 LAB — MAGNESIUM: Magnesium: 2.2 mg/dL (ref 1.7–2.4)

## 2018-05-06 MED ORDER — ATORVASTATIN CALCIUM 20 MG PO TABS
20.0000 mg | ORAL_TABLET | Freq: Every day | ORAL | Status: DC
  Administered 2018-05-06 – 2018-05-07 (×2): 20 mg via ORAL
  Filled 2018-05-06 (×3): qty 1

## 2018-05-06 MED ORDER — NITROGLYCERIN 2 % TD OINT
1.0000 [in_us] | TOPICAL_OINTMENT | Freq: Once | TRANSDERMAL | Status: AC
  Administered 2018-05-06: 1 [in_us] via TOPICAL
  Filled 2018-05-06: qty 30

## 2018-05-06 MED ORDER — ACETAMINOPHEN 160 MG/5ML PO SOLN: 650.0000 mg | ORAL | Status: DC | PRN

## 2018-05-06 MED ORDER — ACETAMINOPHEN 325 MG PO TABS
650.0000 mg | ORAL_TABLET | ORAL | Status: DC | PRN
  Administered 2018-05-06 – 2018-05-07 (×2): 650 mg via ORAL
  Filled 2018-05-06 (×2): qty 2

## 2018-05-06 MED ORDER — INSULIN DETEMIR 100 UNIT/ML ~~LOC~~ SOLN
10.0000 [IU] | Freq: Every day | SUBCUTANEOUS | Status: DC
  Administered 2018-05-06 – 2018-05-09 (×4): 10 [IU] via SUBCUTANEOUS
  Filled 2018-05-06 (×4): qty 0.1

## 2018-05-06 MED ORDER — PAROXETINE HCL 20 MG PO TABS
40.0000 mg | ORAL_TABLET | Freq: Every day | ORAL | Status: DC
  Administered 2018-05-07: 40 mg via ORAL
  Filled 2018-05-06: qty 2

## 2018-05-06 MED ORDER — MORPHINE SULFATE (PF) 2 MG/ML IV SOLN
2.0000 mg | Freq: Once | INTRAVENOUS | Status: AC
  Administered 2018-05-06: 2 mg via INTRAVENOUS

## 2018-05-06 MED ORDER — BUSPIRONE HCL 15 MG PO TABS
15.0000 mg | ORAL_TABLET | Freq: Every day | ORAL | Status: DC
  Administered 2018-05-07: 15 mg via ORAL
  Filled 2018-05-06: qty 1

## 2018-05-06 MED ORDER — MORPHINE SULFATE (PF) 2 MG/ML IV SOLN
INTRAVENOUS | Status: AC
  Administered 2018-05-06: 2 mg via INTRAVENOUS
  Filled 2018-05-06: qty 1

## 2018-05-06 MED ORDER — LORATADINE 10 MG PO TABS
10.0000 mg | ORAL_TABLET | Freq: Every day | ORAL | Status: DC
  Administered 2018-05-07: 10 mg via ORAL
  Filled 2018-05-06 (×2): qty 1

## 2018-05-06 MED ORDER — FUROSEMIDE 10 MG/ML IJ SOLN
40.0000 mg | Freq: Once | INTRAMUSCULAR | Status: AC
  Administered 2018-05-06: 40 mg via INTRAVENOUS

## 2018-05-06 MED ORDER — PANTOPRAZOLE SODIUM 40 MG PO TBEC
40.0000 mg | DELAYED_RELEASE_TABLET | Freq: Every day | ORAL | Status: DC
  Administered 2018-05-06 – 2018-05-07 (×2): 40 mg via ORAL
  Filled 2018-05-06 (×2): qty 1

## 2018-05-06 MED ORDER — MIDAZOLAM HCL 2 MG/2ML IJ SOLN
INTRAMUSCULAR | Status: AC
  Filled 2018-05-06: qty 2

## 2018-05-06 MED ORDER — ORAL CARE MOUTH RINSE
15.0000 mL | Freq: Two times a day (BID) | OROMUCOSAL | Status: DC
  Administered 2018-05-06 – 2018-05-07 (×3): 15 mL via OROMUCOSAL

## 2018-05-06 MED ORDER — MIRTAZAPINE 30 MG PO TBDP: 30.0000 mg | ORAL_TABLET | Freq: Every day | ORAL | Status: DC

## 2018-05-06 MED ORDER — FUROSEMIDE 10 MG/ML IJ SOLN
INTRAMUSCULAR | Status: AC
  Administered 2018-05-06: 40 mg via INTRAVENOUS
  Filled 2018-05-06: qty 4

## 2018-05-06 MED ORDER — MIRTAZAPINE 15 MG PO TABS
30.0000 mg | ORAL_TABLET | Freq: Every day | ORAL | Status: DC
  Administered 2018-05-07: 30 mg via ORAL
  Filled 2018-05-06: qty 2

## 2018-05-06 MED ORDER — FUROSEMIDE 10 MG/ML IJ SOLN
40.0000 mg | Freq: Four times a day (QID) | INTRAMUSCULAR | Status: DC
  Administered 2018-05-07: 40 mg via INTRAVENOUS
  Filled 2018-05-06: qty 4

## 2018-05-06 NOTE — Progress Notes (Signed)
ABG results verified and RBV by Hayden Pedro, NP at 2149 on 05/06/2018 by Otho Ket, RRT

## 2018-05-06 NOTE — Progress Notes (Signed)
Progress Note  Patient Name: Dawn Howe Date of Encounter: 05/06/2018  Primary Cardiologist: Dr. Johnsie Cancel   Subjective   Patient extubated. Sitting upright, throat is sore and voice is hoarse. States breathing is fair. No chest pain.   Inpatient Medications    Scheduled Meds: . atorvastatin  20 mg Oral q1800  . [START ON 05/07/2018] busPIRone  15 mg Oral Daily  . enoxaparin (LOVENOX) injection  40 mg Subcutaneous Q24H  . insulin aspart  0-15 Units Subcutaneous Q4H  . insulin detemir  10 Units Subcutaneous Daily  . ipratropium-albuterol  3 mL Nebulization Q6H  . mouth rinse  15 mL Mouth Rinse BID  . methylPREDNISolone (SOLU-MEDROL) injection  40 mg Intravenous Q6H  . mirtazapine  30 mg Oral QHS  . pantoprazole  40 mg Oral Q1200  . [START ON 05/07/2018] PARoxetine  40 mg Oral Daily   Continuous Infusions: . sodium chloride 10 mL/hr at 05/06/18 0600  . cefTAZidime (FORTAZ)  IV 1 g (05/06/18 1033)  . dexmedetomidine (PRECEDEX) IV infusion Stopped (05/05/18 2231)   PRN Meds: sodium chloride, acetaminophen (TYLENOL) oral liquid 160 mg/5 mL, albuterol, fentaNYL (SUBLIMAZE) injection, polyethylene glycol, senna-docusate   Vital Signs    Vitals:   05/06/18 0751 05/06/18 0753 05/06/18 0800 05/06/18 1003  BP: 138/76 138/76 (!) 142/78   Pulse: (!) 103 (!) 103 100   Resp: 18 12 12    Temp:   99 F (37.2 C)   TempSrc:      SpO2: 100% 99% 99% 100%  Weight:      Height:        Intake/Output Summary (Last 24 hours) at 05/06/2018 1335 Last data filed at 05/06/2018 0600 Gross per 24 hour  Intake 1067.01 ml  Output 1992.5 ml  Net -925.49 ml   Filed Weights   05/04/18 0213 05/05/18 0230 05/06/18 0500  Weight: 182 lb 12.2 oz (82.9 kg) 182 lb 12.2 oz (82.9 kg) 172 lb 2.9 oz (78.1 kg)    Physical Exam   General: Elderly, NAD Skin: Warm, dry, intact  Head: Normocephalic, atraumatic, clear, moist mucus membranes. Neck: Negative for carotid bruits. No apparent JVD at 90  degrees. Lungs: Breathing independently, no increased work of breathing. Coarse throughout Cardiovascular: RRR with normal S1 S2. No murmurs, rubs, gallops, or LV heave appreciated. Abdomen: Soft, non-tender, mildly distended with normoactive bowel sounds. MSK: moving all limbs independently. Extremities:  Improved upper extremity edema with trace lower extremity edema. Neuro: no gross focal deficits Psych: awake, alert, responds to questions  Labs    Chemistry Recent Labs  Lab 05/04/18 0508 05/05/18 0419 05/06/18 0507  NA 144 143 145  K 4.2 3.8 3.7  CL 102 104 97*  CO2 36* 35* 36*  GLUCOSE 117* 244* 249*  BUN 37* 37* 40*  CREATININE 0.68 0.66 0.96  CALCIUM 9.0 8.4* 9.2  GFRNONAA >60 >60 58*  GFRAA >60 >60 >60  ANIONGAP 6 4* 12     Hematology Recent Labs  Lab 05/04/18 0508 05/05/18 0419 05/06/18 0507  WBC 12.9* 12.5* 22.0*  RBC 3.69* 3.75* 4.21  HGB 10.5* 10.6* 11.9*  HCT 35.0* 34.9* 39.6  MCV 94.9 93.1 94.1  MCH 28.5 28.3 28.3  MCHC 30.0 30.4 30.1  RDW 13.7 13.3 13.8  PLT 248 223 287   Cardiac Enzymes No results for input(s): TROPONINI in the last 168 hours. No results for input(s): TROPIPOC in the last 168 hours.   BNPNo results for input(s): BNP, PROBNP in the last  168 hours.   DDimer No results for input(s): DDIMER in the last 168 hours.   Radiology    Dg Chest Port 1 View  Result Date: 05/05/2018 CLINICAL DATA:  Respiratory failure EXAM: PORTABLE CHEST 1 VIEW COMPARISON:  05/04/2018 FINDINGS: Endotracheal tube, nasogastric catheter and left jugular central line are noted in satisfactory position. The cardiac shadow is stable. The lungs are well aerated bilaterally. Mild midlung atelectatic changes are noted on the left stable from the prior exam but previously obscured by overlying wires. No sizable effusion is seen. Mild interstitial coarsening is noted. IMPRESSION: Stable appearance of the chest when compared with the previous day. Electronically Signed    By: Inez Catalina M.D.   On: 05/05/2018 07:00   Telemetry    05/05/18 ST and NSR - Personally Reviewed  ECG    No new tracings   Cardiac Studies   Echocardiogram 05/01/18: Study Conclusions  - Left ventricle: The cavity size was normal. Wall thickness was normal. Systolic function was mildly reduced. The estimated ejection fraction was in the range of 45% to 50%. Diffuse hypokinesis. Doppler parameters are consistent with abnormal left ventricular relaxation (grade 1 diastolic dysfunction). - Mitral valve: There was mild regurgitation. - Left atrium: The atrium was moderately dilated. - Pulmonary arteries: PA peak pressure: 36 mm Hg (S). - Pericardium, extracardiac: A small pericardial effusion was identified.  Recommendations: Mild global reduction in LV systolic function; mild diastolic dysfunction; mild MR; moderate LAE; cannot R/O mass compressing right atrium on subcostal images; suggest cardiac CTA to further assess; small pericardial effusion. Negative saline microcavitation study.  Patient Profile     72 y.o. female with a hx of COPD with chronic respiratory failure on home oxygen, lung cancer status post radiation and chemo, hypertension, hyperlipidemiawho is being followed by Cardiology for the evaluation ofCHFat the request of Dr. Nelda Marseille.  Assessment & Plan    1. SEMI (subendocardial MI): -Pt admitted with acute respiratory failure, transferred from [facility] and found to have an elevated trop of 0.66, trended down to 0.51 -Likely demand ischemia in the setting of acute respiratory failure -Echo with LVEF of 45-50% with diffuse hypokinesis and G1DD -Continue statin -once stable from a respiratory standpoint, can consider angiography given initial presentation was concerning for ACS vs. takotsubo's. Will need to be a discussion based on results of lung workup (ie prognosis, is she a candidate for DAPT). As she will need to lie flat for  procedure, anticipate that she will need to continue to improve her respiratory status over the weekend.  2. Acute systolic and diastolic heart failure: -Per echocardiogram with LVEF of 45% to 50% with diffuse hypokinesis and grade 1 diastolic dysfunction performed 05/01/18. BNP on admission 544. -Bubble study negative for R>L shunting  -Recommend PRN diuresis, creatinine stable and diureses well with small dose of lasix.  3. Acute respiratory failure: -Hx of chemo/radiation for lung CA CT chest showed hilar mass with b/l pulmonary artery involvement and possible lymphangitic spread -extubated this AM -management per PCCM  4. HTN: -BP higher now that extubated/off sedation. Despite SEMI and borderline EF, concern that a beta blocker might worsen her tenuous respiratory status. Would keep <194 systolic with PRN meds like hydralazine in case she decompensates and needs sedation again. Once she has been stable for several days, can begin to add PO medications.  5. Tobacco use: will continue to counsel   6. COPD: -Per primary team  TIME SPENT WITH PATIENT: >35 minutes of direct patient  care. More than 50% of that time was spent on coordination of care and counseling regarding respiratory status and next steps for cardiac evaluation.  Buford Dresser, MD, PhD [facility]   For questions or updates, please contact   Please consult [email or web address] for contact info under Cardiology/STEMI.

## 2018-05-06 NOTE — Progress Notes (Signed)
RN arrived at bedside d/t bipap alarming at 2055. Pt coughing, asked for bipap to be removed and asked for claritin for allergies. Pt placed back on 4L [place]. Patient experienced increased distress, inspiratory and expiratory wheezing, elevated HR and elevated BP, O2 sats dropped to 80s. E link and RT called. PRN albuterol treatment given and placed back on BiPAP. O2 improved with BiPAP, but HR and BP still elevated. MD and NP at bedside ordered further medication, see MD note and MAR. Precedex restarted and scheduled solucortef given, see MAR. At this time HR 113, BP 133/82, RR 24. Pt states at this time that she feels she is breathing "a little better." BBS still inspiratory and expiratory wheezing. RN will continue to monitor.

## 2018-05-06 NOTE — Progress Notes (Signed)
Pt stated lost teeth at [facility]

## 2018-05-06 NOTE — Evaluation (Addendum)
Physical Therapy Evaluation Patient Details Name: Dawn Howe MRN: [id number] DOB: 1946/02/04 Today's Date: 05/06/2018   History of Present Illness  72 year old female active smoker who lives in [place] and is traveling to [place] with a friend, with known history of lung cancer status post radiation and chemo, CHF, CAD, hypertension, COPD on 2 L nasal cannula at baseline. CTA chest was negative for PE but did show hilar lung mass with bilateral pulmonary artery branch involvement and?  Lymphangitic spread of tumor.  Work-up also revealed elevated troponin and nonspecific EKG changes, and echocardiogram showed severe global hypokinesis and EF <20%.   Clinical Impression  PTA pt independent in ambulation and all ADLs in [place] to clean out deceased sisters home and settle her affairs. Pt currently limited in her mobility by intubation. Pt with good ROM in all 4 extremities and 4/5 strength in LE. Extubation pending and PT will work to progress mobility. Pt would be good candidate for CIR level rehab given home support and prior level of function.     Follow Up Recommendations CIR    Equipment Recommendations  Rolling walker with 5" wheels    Recommendations for Other Services OT consult;Rehab consult     Precautions / Restrictions Restrictions Weight Bearing Restrictions: No      Mobility  Bed Mobility Overal bed mobility: Needs Assistance Bed Mobility: Rolling Rolling: Min assist         General bed mobility comments: min A in rolling R/L for management of lines  Transfers                 General transfer comment: did not attempt today due to intubation            Pertinent Vitals/Pain Pain Assessment: 0-10 Pain Score: 3  Pain Location: R ear Pain Intervention(s): Monitored during session;Repositioned    Home Living Family/patient expects to be discharged to:: Private residence Living Arrangements: Children(son and daughter-in law) Available Help  at Discharge: Family;Available 24 hours/day Type of Home: House Home Access: Level entry     Home Layout: One level Home Equipment: Walker - 2 wheels;Shower seat;Grab bars - tub/shower;Bedside commode;Hand held shower head      Prior Function Level of Independence: Independent                  Extremity/Trunk Assessment   Upper Extremity Assessment Upper Extremity Assessment: Overall WFL for tasks assessed    Lower Extremity Assessment Lower Extremity Assessment: RLE deficits/detail;LLE deficits/detail RLE Deficits / Details: ROM WFL, strength of hips, knee and ankle 4/5 RLE Sensation: WNL LLE Deficits / Details: ROM WFL, strength of hips, knee and ankle 4/5 LLE Sensation: WNL       Communication   Communication: Other (comment)(intubated)  Cognition Arousal/Alertness: Awake/alert Behavior During Therapy: WFL for tasks assessed/performed Overall Cognitive Status: Difficult to assess                                 General Comments: answers yes/no questions appropriately, husband says today she is more like herself, however she is in restraints at time of evaluation       General Comments General comments (skin integrity, edema, etc.): VSS, intubated on with SaO2 of 98% on 40% FiO2, PEEp 5        Assessment/Plan    PT Assessment Patient needs continued PT services  PT Problem List Decreased mobility;Decreased activity tolerance;Cardiopulmonary status limiting activity;Decreased safety awareness  PT Treatment Interventions DME instruction;Gait training;Functional mobility training;Therapeutic activities;Therapeutic exercise;Balance training;Patient/family education    PT Goals (Current goals can be found in the Care Plan section)  Acute Rehab PT Goals Patient Stated Goal: none stated PT Goal Formulation: With patient Time For Goal Achievement: 05/20/18 Potential to Achieve Goals: Fair    Frequency Min 3X/week    AM-PAC PT "6  Clicks" Daily Activity  Outcome Measure Difficulty turning over in bed (including adjusting bedclothes, sheets and blankets)?: Unable Difficulty moving from lying on back to sitting on the side of the bed? : Unable Difficulty sitting down on and standing up from a chair with arms (e.g., wheelchair, bedside commode, etc,.)?: Unable Help needed moving to and from a bed to chair (including a wheelchair)?: Total Help needed walking in hospital room?: Total Help needed climbing 3-5 steps with a railing? : Total 6 Click Score: 6    End of Session Equipment Utilized During Treatment: Oxygen Activity Tolerance: Patient tolerated treatment well Patient left: in bed;with call bell/phone within reach;with nursing/sitter in room;with restraints reapplied;with bed alarm set Nurse Communication: Mobility status PT Visit Diagnosis: Muscle weakness (generalized) (M62.81);Difficulty in walking, not elsewhere classified (R26.2)    Time: 5053-9767 PT Time Calculation (min) (ACUTE ONLY): 23 min   Charges:   PT Evaluation $PT Eval High Complexity: 1 High PT Treatments $Therapeutic Activity: 8-22 mins   PT G Codes:        Quinterrius Errington B. Migdalia Dk PT, DPT Acute Rehabilitation  [phone] Pager [phone]    [place] 05/06/2018, 9:36 AM

## 2018-05-06 NOTE — Progress Notes (Signed)
PULMONARY / CRITICAL CARE MEDICINE   Name: Dawn Howe MRN: [id number] DOB: Apr 16, 1946    ADMISSION DATE:  04/29/2018  REFERRING MD:  Oval Linsey  CHIEF COMPLAINT:  Short of breath  HISTORY OF PRESENT ILLNESS:   72 yo female smoker travelling from [place] presented to [facility] hospital with dyspnea, and near syncope.  Required intubation for hypoxia/hypercapnia.  CT chest showed hilar mass with b/l pulmonary artery involvement and possible lymphangitic spread.  Echo showed systolic dysfunction.  PAST MEDICAL HISTORY :  Lung cancer s/p chemoradiation, COPD on 2 liters, CAD, CHF, HTN.   SUBJECTIVE:  Tolerating SBT.  Denies chest pain, abdominal pain.  VITAL SIGNS: BP 138/76   Pulse (!) 103   Temp 98.8 F (37.1 C)   Resp 12   Ht 5\' 4"  (1.626 m)   Wt 172 lb 2.9 oz (78.1 kg)   SpO2 99%   BMI 29.55 kg/m   HEMODYNAMICS: CVP:  [5 mmHg-11 mmHg] 11 mmHg  VENTILATOR SETTINGS: Vent Mode: PSV;CPAP FiO2 (%):  [40 %] 40 % Set Rate:  [16 bmp] 16 bmp Vt Set:  [440 mL-470 mL] 440 mL PEEP:  [5 cmH20] 5 cmH20 Pressure Support:  [5 cmH20] 5 cmH20 Plateau Pressure:  [16 cmH20-23 cmH20] 16 cmH20  INTAKE / OUTPUT: I/O last 3 completed shifts: In: 2599.4 [I.V.:477.6; NG/GT:1326.4; IV Piggyback:795.4] Out: 4680 [Urine:4680]  PHYSICAL EXAMINATION:  General - alert Eyes - pupils reactive ENT - ETT in place Cardiac - regular, no murmur Chest - decreased BS, better air movement, no wheeze Abd - soft, non tender Ext - no edema Skin - no rashes Neuro - follows commands   LABS:  BMET Recent Labs  Lab 05/04/18 0508 05/05/18 0419 05/06/18 0507  NA 144 143 145  K 4.2 3.8 3.7  CL 102 104 97*  CO2 36* 35* 36*  BUN 37* 37* 40*  CREATININE 0.68 0.66 0.96  GLUCOSE 117* 244* 249*   Electrolytes Recent Labs  Lab 05/04/18 0508  05/05/18 0419 05/05/18 1817 05/06/18 0507  CALCIUM 9.0  --  8.4*  --  9.2  MG  --    < > 2.2 2.1 2.2  PHOS  --    < > 3.3 3.4 3.6   < > = values  in this interval not displayed.   CBC Recent Labs  Lab 05/04/18 0508 05/05/18 0419 05/06/18 0507  WBC 12.9* 12.5* 22.0*  HGB 10.5* 10.6* 11.9*  HCT 35.0* 34.9* 39.6  PLT 248 223 287   ABG Recent Labs  Lab 05/02/18 0404 05/04/18 0657 05/04/18 0930  PHART 7.394 7.183* 7.450  PCO2ART 54.1* 93.1* 47.8  PO2ART 114.0* 104 162*   Glucose Recent Labs  Lab 05/05/18 1128 05/05/18 1625 05/05/18 2011 05/06/18 0022 05/06/18 0337 05/06/18 0754  GLUCAP 283* 88 192* 257* 277* 213*   Imaging No results found.   STUDIES:  CT angio chest 6/27 [facility]) >> tumor encasement or b/l PA, b/l hilar masses, centrilobular emphysema, septal thickening, scattered nodules Echo 6/30 >> EF 45 to 50%, grade 1 DD, mod LA dilation, mild MR, PAS 36 mmHg  CULTURES: Urine 6/27 >> negative Sputum 6/27 >> Pseudomonas aeruginosa Blood 6/27 >> negative  ANTIBIOTICS: Zithromax 6/27 >> 6/28 Rocephin 6/27 >> 6/28 Fortaz 6/29 >>  SIGNIFICANT EVENTS: 6/27 transfer from [place], cardiology consulted 6/30 self extubated >> stridor >> reintubated, start decadron 7/02 extubated 7/03 wheeze, hypoxia, AMS >> reintubated 7/05 extubate  LINES/TUBES: ETT 6/27 >> 6/30 (self extubation) Lt IJ CVL 6/27 >> Rt  radial aline 6/27 >> 7/01 ETT 6/30 >> 7/02 ETT 7/03 >> 7/05  DISCUSSION: 72 yo female smoker with acute on chronic hypoxic/hypercapnic respiratory failure from Pseudomonal pneumonia in setting of COPD with emphysema, lung cancer, systolic CHF.  Self extubated 6/30 with post extubation stridor and reintubated.  Extubated on 7/02.  Developed hypoxia/hypercapnia, wheezing, AMS 7/03 and reintubated.  Improved 7/05 >> will try extubation again.  She would be okay with reintubation and trach if she fails extubation again.  ASSESSMENT / PLAN:  Acute on chronic respiratory failure with hypoxia, hypercapnia. COPD exacerbation. COPD with emphysema. Post extubation stridor. - extubate again  7/05 - prn bipap - continue solumedrol 40 mg q6h - scheduled duoneb - oxygen to keep SpO2 90 to 95%  Pseudomonal pneumonia. - day 7/7 of fortaz >> d/c after dose on 7/05  Hx of lung cancer. - d/w her oncologist in [place] >> cancer was from several years ago, and radiographic findings on CT chest 6/27 likely represent new findings - will try to get her stable to discharge and move back to [place] where she can have f/u with pulmonary their for bronchoscopy - if she needs reintubation and trach, then would probably need to arrange for EBUS here  Acute systolic CHF. NSTEMI Type II. Hx of HLD. 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 - SSI with levemir  Acute metabolic encephalopathy. Hx of depression, anxiety. - Buspar, remeron, paxil  DVT prophylaxis - lovenox SUP - protonix Nutrition - tube feeds Goals of care - full code.  Pt okay with reintubation and trach if needed.  Updated pt's husband at bedside  CC time 57 minutes  Chesley Mires, MD [facility] Pulmonary/Critical Care 05/06/2018, 9:35 AM

## 2018-05-06 NOTE — Evaluation (Signed)
Clinical/Bedside Swallow Evaluation Patient Details  Name: Dawn Howe MRN: [id number] Date of Birth: [birth year]  Today's Date: 05/06/2018 Time: SLP Start Time (ACUTE ONLY): 3474 SLP Stop Time (ACUTE ONLY): 1353 SLP Time Calculation (min) (ACUTE ONLY): 12 min  Past Medical History:  Past Medical History:  Diagnosis Date  . COPD (chronic obstructive pulmonary disease) ([place])   . Hypertension   . Lung cancer [facility])    Past Surgical History:  The histories are not reviewed yet. Please review them in the "History" navigator section and refresh this [place]. HPI:  Pt is a 72 yo female smoker travelling from [place] who presented to [facility] with acute on chronic hypoxic/hypercapnic respiratory failure from Pseudomonal pneumonia in setting of COPD with emphysema, lung cancer, systolic CHF.  ETT 6/27-6/30 (self-extubated), 6/30-7/2, 7/3-7/5. CT chest showed hilar mass with b/l pulmonary artery involvement and possible lymphangitic spread.    Assessment / Plan / Recommendation Clinical Impression    Pt's voice is severely dysphonic following multiple intubations, including one self-extubation. Intermittent coughing is noted as she consumes her lunch tray. Recommend proceeding with MBS to better assess oropharyngeal function given high risk for aspiration post-extubation.   SLP Visit Diagnosis: Dysphagia, unspecified (R13.10)    Aspiration Risk  Moderate aspiration risk    Diet Recommendation Other (Comment)(defer pending MBS, to be completed immediately following)   Medication Administration: Crushed with puree    Other  Recommendations Oral Care Recommendations: Oral care QID   Follow up Recommendations        Frequency and Duration            Prognosis Prognosis for Safe Diet Advancement: Good      Swallow Study   General HPI: Pt is a 72 yo female smoker travelling from [place] who presented to [place] with acute on chronic hypoxic/hypercapnic  respiratory failure from Pseudomonal pneumonia in setting of COPD with emphysema, lung cancer, systolic CHF.  ETT 6/27-6/30 (self-extubated), 6/30-7/2, 7/3-7/5. CT chest showed hilar mass with b/l pulmonary artery involvement and possible lymphangitic spread.  Type of Study: Bedside Swallow Evaluation Previous Swallow Assessment: none in chart Diet Prior to this Study: Regular;Thin liquids Temperature Spikes Noted: No Respiratory Status: Nasal cannula History of Recent Intubation: Yes Length of Intubations (days): 9 days(across 3 intubations) Date extubated: 05/06/18 Behavior/Cognition: Alert;Cooperative;Pleasant mood Oral Care Completed by SLP: No Oral Cavity - Dentition: Missing dentition;Other (Comment)(dentures lost per pt) Vision: Functional for self-feeding Self-Feeding Abilities: Able to feed self Patient Positioning: Upright in bed Baseline Vocal Quality: Hoarse;Low vocal intensity Volitional Cough: Congested    Oral/Motor/Sensory Function     Ice Chips Ice chips: Not tested   Thin Liquid Thin Liquid: Impaired Presentation: Self Fed;Straw Pharyngeal  Phase Impairments: Cough - Delayed    Nectar Thick Nectar Thick Liquid: Not tested   Honey Thick Honey Thick Liquid: Not tested   Puree Puree: Not tested   Solid   GO   Solid: Impaired Presentation: Spoon Pharyngeal Phase Impairments: Cough - Delayed        Germain Osgood 05/06/2018,3:29 PM  Germain Osgood, M.A. CCC-SLP [phone]

## 2018-05-06 NOTE — Progress Notes (Addendum)
Rehab Admissions Coordinator Note:  Patient was screened by Cleatrice Burke for appropriateness for an Inpatient Acute Rehab Consult per PT recommendation. Noted pt just extubated. I recommend an inpt rehab consult. Please place order.  Cleatrice Burke 05/06/2018, 11:04 AM  I can be reached at [phone].

## 2018-05-06 NOTE — Progress Notes (Signed)
OVERNIGHT COVERAGE CRITICAL CARE PROGRESS NOTE  CTSP re: respiratory distress.  Dj vu all over again, ear really similar encounter to that which occurred on 7/3.  Liberated from vent yesterday (7/4) and reportedly did well throughout the day and was only needing nasal cannula oxygen.  Review of dayshift I/O records suggest that the patient voided 625 mL during day shift.  On the previous day, she had a total urine output of 3.8 L. Nurse reports that patient suddenly complained of acute respiratory distress and demonstrated tripodding and increased work of breathing.  Today, the patient also complained of paroxysmal twitchy coughing.  The patient reports she struggles with seasonal allergies and had asked for Claritin.  Patient is currently on Solu-Cortef.  The patient became tachypneic and demonstrated oxygen desaturation.   At the time of clinical examination, the patient has been placed on BiPAP 17 (12 over PEEP)/5. Temp 97.4. HR 150s, SBP 200+, RR 30.  Awake and alert.  Follows commands.  Obvious labored respirations.  Albuterol was administered prior to my arrival without clear clinical benefit and resulting in worsening tachycardia and HYPERtension. No reported relief.  On examination, the patient demonstrates diminished air entry bilaterally with coarse crackles and wheezes.  Furosemide 40 mg IV x3 doses.  1 inch Nitropaste.   Morphine 2 mg IV single dose. Continue BiPAP. ABG in 1 hour.  Critical care time: 30 minutes.  The treatment and management of the patient's condition was required based on the threat of imminent deterioration. This time reflects time spent by the physician evaluating, providing care and managing the critically ill patient's care. The time was spent at the immediate bedside (or on the same floor/unit and dedicated to this patient's care). Time involved in separately billable procedures is NOT included int he critical care time indicated above. Family meeting and update  time may be included above if and only if the patient is unable/incompetent to participate in clinical interview and/or decision making, and the discussion was necessary to determining treatment decisions.  Renee Pain, MD Board Certified by the ABIM, [place]

## 2018-05-06 NOTE — Progress Notes (Signed)
Modified Barium Swallow Progress Note  Patient Details  Name: Dawn Howe MRN: [id number] Date of Birth: January 29, 1946  Today's Date: 05/06/2018  Modified Barium Swallow completed.  Full report located under Chart Review in the Imaging Section.  Brief recommendations include the following:  Clinical Impression  Pt presents with oropharyngeal swallow within gross functional limits given age and missing dentition. She has mildly prolonged mastication and intermittent flash penetration, but with no aspiration despite presentation of large, sequential boluses and mixed consistencies. Recommend continuing with regular diet textures and thin liquids, allowing pt to select foods that are soft enough for her to masticate without her dentures as they are reportedly missing. SLP will f/u briefly for tolerance, likely without need for additional f/u post-discharge.   Swallow Evaluation Recommendations       SLP Diet Recommendations: Regular solids;Thin liquid   Liquid Administration via: Cup;Straw   Medication Administration: Whole meds with liquid   Supervision: Patient able to self feed;Intermittent supervision to cue for compensatory strategies   Compensations: Slow rate;Small sips/bites   Postural Changes: Seated upright at 90 degrees   Oral Care Recommendations: Oral care BID        Germain Osgood 05/06/2018,3:35 PM   Germain Osgood, M.A. CCC-SLP [phone]

## 2018-05-06 NOTE — Progress Notes (Signed)
Pt does not have a cuff leak at this time. RN aware.

## 2018-05-06 NOTE — Progress Notes (Signed)
SLP Cancellation Note  Patient Details Name: Dawn Howe MRN: [id number] DOB: 09-30-1946   Cancelled treatment:       Reason Eval/Treat Not Completed: Medical issues which prohibited therapy. Orders received for swallow evaluation post-extubation; however, pt remains on the vent and without cuff leak per RT note this morning. Will follow for ability to attempt POs once able to be extubated.   Germain Osgood 05/06/2018, 8:40 AM  Germain Osgood, M.A. CCC-SLP [phone]

## 2018-05-06 NOTE — Procedures (Signed)
Extubation Procedure Note  Patient Details:   Name: Dawn Howe DOB: 08-21-1946 MRN: [id number]   Airway Documentation:    Vent end date: 05/06/18 Vent end time: 1001   No cuff leak prior to extubation. MD and RN aware. MD instructed RT to proceed without cuff leak.   Evaluation  O2 sats: stable throughout Complications: No apparent complications Patient did tolerate procedure well. Bilateral Breath Sounds: Expiratory wheezes   Yes- pt has clear ability to speak.  Pt extubated to 4L [name] with RN and husband at bedside.   [name] 05/06/2018, 10:01 AM

## 2018-05-07 ENCOUNTER — Inpatient Hospital Stay (HOSPITAL_COMMUNITY): Payer: Medicare Other

## 2018-05-07 DIAGNOSIS — I248 Other forms of acute ischemic heart disease: Secondary | ICD-10-CM | POA: Insufficient documentation

## 2018-05-07 LAB — CBC
HEMATOCRIT: 36.9 % (ref 36.0–46.0)
Hemoglobin: 11.1 g/dL — ABNORMAL LOW (ref 12.0–15.0)
MCH: 28.7 pg (ref 26.0–34.0)
MCHC: 30.1 g/dL (ref 30.0–36.0)
MCV: 95.3 fL (ref 78.0–100.0)
PLATELETS: 218 10*3/uL (ref 150–400)
RBC: 3.87 MIL/uL (ref 3.87–5.11)
RDW: 13.9 % (ref 11.5–15.5)
WBC: 20 10*3/uL — ABNORMAL HIGH (ref 4.0–10.5)

## 2018-05-07 LAB — GLUCOSE, CAPILLARY
GLUCOSE-CAPILLARY: 124 mg/dL — AB (ref 70–99)
GLUCOSE-CAPILLARY: 183 mg/dL — AB (ref 70–99)
GLUCOSE-CAPILLARY: 185 mg/dL — AB (ref 70–99)
GLUCOSE-CAPILLARY: 214 mg/dL — AB (ref 70–99)
Glucose-Capillary: 155 mg/dL — ABNORMAL HIGH (ref 70–99)
Glucose-Capillary: 213 mg/dL — ABNORMAL HIGH (ref 70–99)
Glucose-Capillary: 239 mg/dL — ABNORMAL HIGH (ref 70–99)

## 2018-05-07 LAB — POCT I-STAT 3, ART BLOOD GAS (G3+)
ACID-BASE EXCESS: 17 mmol/L — AB (ref 0.0–2.0)
Acid-Base Excess: 17 mmol/L — ABNORMAL HIGH (ref 0.0–2.0)
BICARBONATE: 42.9 mmol/L — AB (ref 20.0–28.0)
Bicarbonate: 41 mmol/L — ABNORMAL HIGH (ref 20.0–28.0)
O2 Saturation: 98 %
O2 Saturation: 100 %
PCO2 ART: 55.6 mmHg — AB (ref 32.0–48.0)
PCO2 ART: 43.5 mmHg (ref 32.0–48.0)
PO2 ART: 97 mmHg (ref 83.0–108.0)
PO2 ART: 156 mmHg — AB (ref 83.0–108.0)
Patient temperature: 98
Patient temperature: 98.9
TCO2: 45 mmol/L — ABNORMAL HIGH (ref 22–32)
TCO2: 42 mmol/L — AB (ref 22–32)
pH, Arterial: 7.494 — ABNORMAL HIGH (ref 7.350–7.450)
pH, Arterial: 7.583 — ABNORMAL HIGH (ref 7.350–7.450)

## 2018-05-07 LAB — TRIGLYCERIDES: TRIGLYCERIDES: 288 mg/dL — AB (ref <150)

## 2018-05-07 LAB — BASIC METABOLIC PANEL
Anion gap: 10 (ref 5–15)
BUN: 39 mg/dL — AB (ref 8–23)
CALCIUM: 8.8 mg/dL — AB (ref 8.9–10.3)
CO2: 38 mmol/L — ABNORMAL HIGH (ref 22–32)
CREATININE: 0.91 mg/dL (ref 0.44–1.00)
Chloride: 99 mmol/L (ref 98–111)
GFR calc Af Amer: >60 mL/min (ref >60)
GLUCOSE: 160 mg/dL — AB (ref 70–99)
Potassium: 5.3 mmol/L — ABNORMAL HIGH (ref 3.5–5.1)
Sodium: 147 mmol/L — ABNORMAL HIGH (ref 135–145)

## 2018-05-07 LAB — MAGNESIUM: MAGNESIUM: 2.4 mg/dL (ref 1.7–2.4)

## 2018-05-07 LAB — PHOSPHORUS: Phosphorus: 4.9 mg/dL — ABNORMAL HIGH (ref 2.5–4.6)

## 2018-05-07 MED ORDER — ALBUTEROL SULFATE (2.5 MG/3ML) 0.083% IN NEBU
2.5000 mg | INHALATION_SOLUTION | Freq: Four times a day (QID) | RESPIRATORY_TRACT | Status: DC
  Administered 2018-05-07 – 2018-05-08 (×4): 2.5 mg via RESPIRATORY_TRACT
  Filled 2018-05-07 (×5): qty 3

## 2018-05-07 MED ORDER — ORAL CARE MOUTH RINSE
15.0000 mL | OROMUCOSAL | Status: DC
  Administered 2018-05-07 – 2018-05-16 (×75): 15 mL via OROMUCOSAL

## 2018-05-07 MED ORDER — ETOMIDATE 2 MG/ML IV SOLN
20.0000 mg | Freq: Once | INTRAVENOUS | Status: AC
  Administered 2018-05-07: 20 mg via INTRAVENOUS

## 2018-05-07 MED ORDER — INSULIN ASPART 100 UNIT/ML ~~LOC~~ SOLN
0.0000 [IU] | SUBCUTANEOUS | Status: DC
  Administered 2018-05-07: 4 [IU] via SUBCUTANEOUS
  Administered 2018-05-07 – 2018-05-08 (×4): 7 [IU] via SUBCUTANEOUS
  Administered 2018-05-08 (×2): 4 [IU] via SUBCUTANEOUS
  Administered 2018-05-08 – 2018-05-09 (×2): 7 [IU] via SUBCUTANEOUS
  Administered 2018-05-09: 11 [IU] via SUBCUTANEOUS
  Administered 2018-05-09: 3 [IU] via SUBCUTANEOUS
  Administered 2018-05-09: 11 [IU] via SUBCUTANEOUS
  Administered 2018-05-09 – 2018-05-10 (×3): 3 [IU] via SUBCUTANEOUS
  Administered 2018-05-10: 4 [IU] via SUBCUTANEOUS
  Administered 2018-05-11 (×2): 3 [IU] via SUBCUTANEOUS
  Administered 2018-05-11: 4 [IU] via SUBCUTANEOUS
  Administered 2018-05-11 – 2018-05-12 (×2): 3 [IU] via SUBCUTANEOUS
  Administered 2018-05-12: 4 [IU] via SUBCUTANEOUS
  Administered 2018-05-13 – 2018-05-15 (×5): 3 [IU] via SUBCUTANEOUS
  Administered 2018-05-15: 4 [IU] via SUBCUTANEOUS

## 2018-05-07 MED ORDER — ARFORMOTEROL TARTRATE 15 MCG/2ML IN NEBU
15.0000 ug | INHALATION_SOLUTION | Freq: Two times a day (BID) | RESPIRATORY_TRACT | Status: DC
  Administered 2018-05-07 (×2): 15 ug via RESPIRATORY_TRACT
  Filled 2018-05-07 (×3): qty 2

## 2018-05-07 MED ORDER — BUDESONIDE 0.25 MG/2ML IN SUSP
0.2500 mg | Freq: Two times a day (BID) | RESPIRATORY_TRACT | Status: DC
  Administered 2018-05-07 – 2018-05-14 (×15): 0.25 mg via RESPIRATORY_TRACT
  Filled 2018-05-07 (×15): qty 2

## 2018-05-07 MED ORDER — METHYLPREDNISOLONE SODIUM SUCC 125 MG IJ SOLR
60.0000 mg | Freq: Four times a day (QID) | INTRAMUSCULAR | Status: DC
Start: 2018-05-07 — End: 2018-05-09
  Administered 2018-05-07 – 2018-05-09 (×7): 60 mg via INTRAVENOUS
  Filled 2018-05-07 (×6): qty 2

## 2018-05-07 MED ORDER — DILTIAZEM HCL-DEXTROSE 100-5 MG/100ML-% IV SOLN (PREMIX)
5.0000 mg/h | INTRAVENOUS | Status: DC
  Administered 2018-05-07: 5 mg/h via INTRAVENOUS
  Filled 2018-05-07: qty 100

## 2018-05-07 MED ORDER — UMECLIDINIUM BROMIDE 62.5 MCG/INH IN AEPB
1.0000 | INHALATION_SPRAY | Freq: Every day | RESPIRATORY_TRACT | Status: DC
  Administered 2018-05-07: 1 via RESPIRATORY_TRACT
  Filled 2018-05-07: qty 7

## 2018-05-07 MED ORDER — INSULIN ASPART 100 UNIT/ML ~~LOC~~ SOLN
0.0000 [IU] | Freq: Three times a day (TID) | SUBCUTANEOUS | Status: DC
  Administered 2018-05-07: 3 [IU] via SUBCUTANEOUS
  Administered 2018-05-07: 7 [IU] via SUBCUTANEOUS
  Administered 2018-05-07: 3 [IU] via SUBCUTANEOUS

## 2018-05-07 MED ORDER — PROPOFOL 500 MG/50ML IV EMUL
INTRAVENOUS | Status: AC
  Administered 2018-05-07: 10 ug/kg/min via INTRAVENOUS
  Filled 2018-05-07: qty 50

## 2018-05-07 MED ORDER — PROPOFOL 1000 MG/100ML IV EMUL
5.0000 ug/kg/min | INTRAVENOUS | Status: DC
  Administered 2018-05-07: 50 ug/kg/min via INTRAVENOUS
  Administered 2018-05-07: 10 ug/kg/min via INTRAVENOUS
  Administered 2018-05-08: 30 ug/kg/min via INTRAVENOUS
  Administered 2018-05-08 – 2018-05-09 (×7): 50 ug/kg/min via INTRAVENOUS
  Administered 2018-05-09: 40 ug/kg/min via INTRAVENOUS
  Administered 2018-05-10 (×5): 50 ug/kg/min via INTRAVENOUS
  Administered 2018-05-11: 30 ug/kg/min via INTRAVENOUS
  Administered 2018-05-11: 35 ug/kg/min via INTRAVENOUS
  Administered 2018-05-11: 50 ug/kg/min via INTRAVENOUS
  Administered 2018-05-11: 30 ug/kg/min via INTRAVENOUS
  Filled 2018-05-07 (×22): qty 100

## 2018-05-07 MED ORDER — SALINE SPRAY 0.65 % NA SOLN
1.0000 | Freq: Two times a day (BID) | NASAL | Status: DC
  Administered 2018-05-07 – 2018-05-16 (×12): 1 via NASAL
  Filled 2018-05-07: qty 44

## 2018-05-07 MED ORDER — FUROSEMIDE 20 MG PO TABS
20.0000 mg | ORAL_TABLET | Freq: Every day | ORAL | Status: DC
  Administered 2018-05-07: 20 mg via ORAL
  Filled 2018-05-07: qty 1

## 2018-05-07 MED ORDER — CHLORHEXIDINE GLUCONATE 0.12% ORAL RINSE (MEDLINE KIT)
15.0000 mL | Freq: Two times a day (BID) | OROMUCOSAL | Status: DC
  Administered 2018-05-07 – 2018-05-15 (×16): 15 mL via OROMUCOSAL

## 2018-05-07 MED ORDER — FLUTICASONE PROPIONATE 50 MCG/ACT NA SUSP
2.0000 | Freq: Every day | NASAL | Status: DC
  Filled 2018-05-07: qty 16

## 2018-05-07 MED ORDER — INSULIN ASPART 100 UNIT/ML ~~LOC~~ SOLN: 0.0000 [IU] | Freq: Every day | SUBCUTANEOUS | Status: DC

## 2018-05-07 MED ORDER — ROCURONIUM BROMIDE 100 MG/10ML IV SOLN
60.0000 mg | Freq: Once | INTRAVENOUS | Status: AC
  Administered 2018-05-07: 60 mg via INTRAVENOUS
  Filled 2018-05-07: qty 6

## 2018-05-07 MED ORDER — FLUTICASONE PROPIONATE 50 MCG/ACT NA SUSP
1.0000 | Freq: Every day | NASAL | Status: DC
  Administered 2018-05-07 – 2018-05-15 (×5): 1 via NASAL
  Filled 2018-05-07: qty 16

## 2018-05-07 MED ORDER — MONTELUKAST SODIUM 10 MG PO TABS
10.0000 mg | ORAL_TABLET | Freq: Every day | ORAL | Status: DC
  Administered 2018-05-07: 10 mg via ORAL
  Filled 2018-05-07: qty 1

## 2018-05-07 NOTE — Procedures (Signed)
Endotracheal intubation  Indication: Respiratory extremis  Procedure: After discussing the situation with the patient and her significant other she confirmed that she would wish to be reintubated for her extreme shortness of breath which is been refractory to BiPAP. My arrival the patient was tripoding with very poor air movement bilaterally even on BiPAP she was hypertensive into the 220s and was in atrial fibrillation with a rate in the mid 140s.  She was preoxygenated with an Ambu bag and sedated with 20 mg of etomidate followed by 60 mg of IV rocuronium.  The vocal cords were easily visualized with a 4 Miller blade and a 7.5 endotracheal tube inserted to 23 cm at the lip.  Was a positive CO2 and symmetric air movement saturations were in the high 90s.  Chest x-ray for placement is pending.

## 2018-05-07 NOTE — Progress Notes (Signed)
Pt took off CPAP to eat dinner, after taking one bite of food pt became tachypneic, tachycardic, anxious with labored breathing.  EKG done bedside showed Afib w/ RVR.  Respiratory arrived and gave PRN albuterol. Dr. Veleta Miners came to bedside decided to intubate.  Cardizem and propofol gtt started after intubation.

## 2018-05-07 NOTE — Progress Notes (Signed)
Progress Note  Patient Name: Dawn Howe Date of Encounter: 05/07/2018  Primary Cardiologist: Dr. Johnsie Cancel   Subjective   Patient extubated. Appears comfortable. Mild tightness with her breathing. No pain.   Inpatient Medications    Scheduled Meds: . albuterol  2.5 mg Nebulization Q6H  . arformoterol  15 mcg Nebulization BID  . atorvastatin  20 mg Oral q1800  . budesonide (PULMICORT) nebulizer solution  0.25 mg Nebulization BID  . busPIRone  15 mg Oral Daily  . enoxaparin (LOVENOX) injection  40 mg Subcutaneous Q24H  . fluticasone  1 spray Each Nare Daily  . furosemide  20 mg Oral Daily  . insulin aspart  0-20 Units Subcutaneous TID WC  . insulin aspart  0-5 Units Subcutaneous QHS  . insulin detemir  10 Units Subcutaneous Daily  . loratadine  10 mg Oral Daily  . mouth rinse  15 mL Mouth Rinse BID  . methylPREDNISolone (SOLU-MEDROL) injection  40 mg Intravenous Q6H  . mirtazapine  30 mg Oral QHS  . montelukast  10 mg Oral QHS  . pantoprazole  40 mg Oral Q1200  . PARoxetine  40 mg Oral Daily  . sodium chloride  1 spray Each Nare BID  . umeclidinium bromide  1 puff Inhalation Daily   Continuous Infusions: . sodium chloride 10 mL/hr at 05/07/18 1000   PRN Meds: sodium chloride, acetaminophen, albuterol, fentaNYL (SUBLIMAZE) injection, polyethylene glycol, senna-docusate   Vital Signs    Vitals:   05/07/18 0800 05/07/18 0827 05/07/18 0900 05/07/18 1100  BP: 133/66  119/62 127/70  Pulse: 92  93 97  Resp: 19  20 20   Temp:      TempSrc:      SpO2: 100% 93% 100% 100%  Weight:      Height:        Intake/Output Summary (Last 24 hours) at 05/07/2018 1154 Last data filed at 05/07/2018 1000 Gross per 24 hour  Intake 750.56 ml  Output 2075 ml  Net -1324.44 ml   Filed Weights   05/05/18 0230 05/06/18 0500 05/07/18 0316  Weight: 182 lb 12.2 oz (82.9 kg) 172 lb 2.9 oz (78.1 kg) 177 lb 0.5 oz (80.3 kg)    Physical Exam   General: Elderly, NAD Skin: Warm, dry,  intact  Head: Normocephalic, atraumatic, clear, moist mucus membranes. Neck: Negative for carotid bruits. No JVD Lungs: Breathing independently, no increased work of breathing. Coarse throughout  Cardiovascular: RRR with normal S1 S2. No murmurs, rubs, gallops, or LV heave appreciated. Abdomen: Soft, non-tender, mildly distended with normoactive bowel sounds. MSK: moving all limbs independently. Extremities:  Improved upper extremity edema with trace lower extremity edema. Neuro: no gross focal deficits Psych: awake, alert, responds to questions  Labs    Chemistry Recent Labs  Lab 05/05/18 0419 05/06/18 0507 05/07/18 0325  NA 143 145 147*  K 3.8 3.7 5.3*  CL 104 97* 99  CO2 35* 36* 38*  GLUCOSE 244* 249* 160*  BUN 37* 40* 39*  CREATININE 0.66 0.96 0.91  CALCIUM 8.4* 9.2 8.8*  GFRNONAA >60 58* >60  GFRAA >60 >60 >60  ANIONGAP 4* 12 10     Hematology Recent Labs  Lab 05/05/18 0419 05/06/18 0507 05/07/18 0325  WBC 12.5* 22.0* 20.0*  RBC 3.75* 4.21 3.87  HGB 10.6* 11.9* 11.1*  HCT 34.9* 39.6 36.9  MCV 93.1 94.1 95.3  MCH 28.3 28.3 28.7  MCHC 30.4 30.1 30.1  RDW 13.3 13.8 13.9  PLT 223 287 218  Cardiac Enzymes No results for input(s): TROPONINI in the last 168 hours. No results for input(s): TROPIPOC in the last 168 hours.   BNPNo results for input(s): BNP, PROBNP in the last 168 hours.   DDimer No results for input(s): DDIMER in the last 168 hours.   Radiology    Dg Swallowing Func-speech Pathology  Result Date: 05/06/2018 Objective Swallowing Evaluation: Type of Study: MBS-Modified Barium Swallow Study  Patient Details Name: Dawn Howe MRN: [id number] Date of Birth: [birth year] Today's Date: 05/06/2018 Time: SLP Start Time (ACUTE ONLY): 3235 -SLP Stop Time (ACUTE ONLY): 1445 SLP Time Calculation (min) (ACUTE ONLY): 12 min Past Medical History: Past Medical History: Diagnosis Date . COPD (chronic obstructive pulmonary disease) ([place])  . Hypertension  . Lung  cancer [facility])  Past Surgical History:  The histories are not reviewed yet. Please review them in the "History" navigator section and refresh this [place]. HPI: Pt is a 72 yo female smoker travelling from [place] who presented to [facility] with acute on chronic hypoxic/hypercapnic respiratory failure from Pseudomonal pneumonia in setting of COPD with emphysema, lung cancer, systolic CHF.  ETT 6/27-6/30 (self-extubated), 6/30-7/2, 7/3-7/5. CT chest showed hilar mass with b/l pulmonary artery involvement and possible lymphangitic spread.  Subjective: pt denies difficulty swallowing Assessment / Plan / Recommendation CHL IP CLINICAL IMPRESSIONS 05/06/2018 Clinical Impression Pt presents with oropharyngeal swallow within gross functional limits given age and missing dentition. She has mildly prolonged mastication and intermittent flash penetration, but with no aspiration despite presentation of large, sequential boluses and mixed consistencies. Recommend continuing with regular diet textures and thin liquids, allowing pt to select foods that are soft enough for her to masticate without her dentures as they are reportedly missing. SLP will f/u briefly for tolerance, likely without need for additional f/u post-discharge. SLP Visit Diagnosis Dysphagia, unspecified (R13.10) Attention and concentration deficit following -- Frontal lobe and executive function deficit following -- Impact on safety and function Mild aspiration risk   CHL IP TREATMENT RECOMMENDATION 05/06/2018 Treatment Recommendations Therapy as outlined in treatment plan below   Prognosis 05/06/2018 Prognosis for Safe Diet Advancement Good Barriers to Reach Goals -- Barriers/Prognosis Comment -- CHL IP DIET RECOMMENDATION 05/06/2018 SLP Diet Recommendations Regular solids;Thin liquid Liquid Administration via Cup;Straw Medication Administration Whole meds with liquid Compensations Slow rate;Small sips/bites Postural Changes Seated upright at 90 degrees   CHL  IP OTHER RECOMMENDATIONS 05/06/2018 Recommended Consults -- Oral Care Recommendations Oral care BID Other Recommendations --   CHL IP FOLLOW UP RECOMMENDATIONS 05/06/2018 Follow up Recommendations None   CHL IP FREQUENCY AND DURATION 05/06/2018 Speech Therapy Frequency (ACUTE ONLY) min 1 x/week Treatment Duration 1 week      CHL IP ORAL PHASE 05/06/2018 Oral Phase WFL Oral - Pudding Teaspoon -- Oral - Pudding Cup -- Oral - Honey Teaspoon -- Oral - Honey Cup -- Oral - Nectar Teaspoon -- Oral - Nectar Cup -- Oral - Nectar Straw -- Oral - Thin Teaspoon -- Oral - Thin Cup -- Oral - Thin Straw -- Oral - Puree -- Oral - Mech Soft -- Oral - Regular -- Oral - Multi-Consistency -- Oral - Pill -- Oral Phase - Comment --  CHL IP PHARYNGEAL PHASE 05/06/2018 Pharyngeal Phase WFL Pharyngeal- Pudding Teaspoon -- Pharyngeal -- Pharyngeal- Pudding Cup -- Pharyngeal -- Pharyngeal- Honey Teaspoon -- Pharyngeal -- Pharyngeal- Honey Cup -- Pharyngeal -- Pharyngeal- Nectar Teaspoon -- Pharyngeal -- Pharyngeal- Nectar Cup -- Pharyngeal -- Pharyngeal- Nectar Straw -- Pharyngeal -- Pharyngeal- Thin Teaspoon -- Pharyngeal --  Pharyngeal- Thin Cup -- Pharyngeal -- Pharyngeal- Thin Straw -- Pharyngeal -- Pharyngeal- Puree -- Pharyngeal -- Pharyngeal- Mechanical Soft -- Pharyngeal -- Pharyngeal- Regular -- Pharyngeal -- Pharyngeal- Multi-consistency -- Pharyngeal -- Pharyngeal- Pill -- Pharyngeal -- Pharyngeal Comment --  CHL IP CERVICAL ESOPHAGEAL PHASE 05/06/2018 Cervical Esophageal Phase WFL Pudding Teaspoon -- Pudding Cup -- Honey Teaspoon -- Honey Cup -- Nectar Teaspoon -- Nectar Cup -- Nectar Straw -- Thin Teaspoon -- Thin Cup -- Thin Straw -- Puree -- Mechanical Soft -- Regular -- Multi-consistency -- Pill -- Cervical Esophageal Comment -- No flowsheet data found. Germain Osgood 05/06/2018, 3:45 PM  Germain Osgood, M.A. CCC-SLP [phone]             Telemetry    05/05/18 ST and NSR - Personally Reviewed  ECG    No new tracings    Cardiac Studies   Echocardiogram 05/01/18: Study Conclusions  - Left ventricle: The cavity size was normal. Wall thickness was normal. Systolic function was mildly reduced. The estimated ejection fraction was in the range of 45% to 50%. Diffuse hypokinesis. Doppler parameters are consistent with abnormal left ventricular relaxation (grade 1 diastolic dysfunction). - Mitral valve: There was mild regurgitation. - Left atrium: The atrium was moderately dilated. - Pulmonary arteries: PA peak pressure: 36 mm Hg (S). - Pericardium, extracardiac: A small pericardial effusion was identified.  Recommendations: Mild global reduction in LV systolic function; mild diastolic dysfunction; mild MR; moderate LAE; cannot R/O mass compressing right atrium on subcostal images; suggest cardiac CTA to further assess; small pericardial effusion. Negative saline microcavitation study.  Patient Profile     72 y.o. female with a hx of COPD with chronic respiratory failure on home oxygen, lung cancer status post radiation and chemo, hypertension, hyperlipidemiawho is being followed by Cardiology for the evaluation ofCHFat the request of Dr. Nelda Marseille.  Assessment & Plan    1. NSTEMI (subendocardial MI):  - most consistent with Takotsubo's (stress induced CM)  - initial Echo showed marked LV dysfunction EF 20-25% later improved to 45-50%.  -Pt admitted with acute respiratory failure, transferred from [facility] and found to have an elevated trop of 0.66, trended down to 0.51 -Echo with LVEF of 45-50% with diffuse hypokinesis and G1DD -Continue statin -Patient reports to me that she has a history of CHF but no history of CAD and reports she had a cardiac cath about 6 months ago in [facility] without significant disease.  - given this information and concern about advanced recurrent CA in chest I would not recommend any further cardiac evaluation here.   2. Acute systolic and diastolic heart  failure: -Per echocardiogram with LVEF of 45% to 50% with diffuse hypokinesis and grade 1 diastolic dysfunction performed 05/01/18. BNP on admission 544. -Bubble study negative for R>L shunting  -Recommend PRN diuresis, creatinine stable and diureses well with small dose of lasix.  3. Acute respiratory failure: -Hx of chemo/radiation for lung CA CT chest showed hilar mass with b/l pulmonary artery involvement and possible lymphangitic spread -extubated this AM -management per PCCM  4. HTN: -BP is well controlled now.  5. Tobacco use: will continue to counsel   6. COPD: -Per primary team  [facility] will sign off.   Medication Recommendations:  Continue current therapy Other recommendations (labs, testing, etc):  none Follow up as an outpatient:  With primary cardiologist in [place]   Naveen Lorusso [place] MD, [facility] 05/07/2018 12:02 PM  For questions or updates, please contact   Please consult [email or web address] for  contact info under Cardiology/STEMI.

## 2018-05-07 NOTE — Progress Notes (Signed)
[place] Progress Note Patient Name: Dawn Howe DOB: 1946-05-01 MRN: [id number]   Date of Service  05/07/2018  HPI/Events of Note  ABG on 40%/PRVC 15/TV 440/P 5 = 7.58/43.5/156/41.   eICU Interventions  Will order: 1. Decrease PRVC rate to 12.  2. Hold lasix until re-evaluated by rounding team in AM. 3. Repeat ABG at 5 AM.     Intervention Category Major Interventions: Acid-Base disturbance - evaluation and management;Respiratory failure - evaluation and management  Crystelle Ferrufino [place] 05/07/2018, 9:07 PM

## 2018-05-07 NOTE — Progress Notes (Signed)
PULMONARY / CRITICAL CARE MEDICINE   Name: Dawn Howe MRN: [id number] DOB: Nov 03, 1945    ADMISSION DATE:  04/04/2018  REFERRING MD:  Oval Linsey  CHIEF COMPLAINT:  Short of breath  HISTORY OF PRESENT ILLNESS:   72 yo female smoker travelling from [place] presented to [facility] hospital with dyspnea, and near syncope.  Required intubation for hypoxia/hypercapnia.  CT chest showed hilar mass with b/l pulmonary artery involvement and possible lymphangitic spread.  Echo showed systolic dysfunction.  PAST MEDICAL HISTORY :  Lung cancer s/p chemoradiation, COPD on 2 liters, CAD, CHF, HTN.   SUBJECTIVE:  C/o sinus congestion and post nasal drip.  Has wheeze and chest tightness.  VITAL SIGNS: BP 122/63   Pulse 82   Temp 98 F (36.7 C) (Oral)   Resp 18   Ht 5\' 4"  (1.626 m)   Wt 177 lb 0.5 oz (80.3 kg)   SpO2 100%   BMI 30.39 kg/m   INTAKE / OUTPUT: I/O last 3 completed shifts: In: 1122.1 [P.O.:120; I.V.:307; NG/GT:495; IV Piggyback:200.1] Out: 2990 [Urine:2990]  PHYSICAL EXAMINATION:  General - pleasant Eyes - pupils reactive ENT - raspy voice, poor dentition, no stridor Cardiac - regular, no murmur Chest - prolonged exhalation, b/l wheeze Abd - soft, non tender Ext - no edema Skin - no rashes Neuro - normal strength Psych - normal mood  LABS:  BMET Recent Labs  Lab 05/05/18 0419 05/06/18 0507 05/07/18 0325  NA 143 145 147*  K 3.8 3.7 5.3*  CL 104 97* 99  CO2 35* 36* 38*  BUN 37* 40* 39*  CREATININE 0.66 0.96 0.91  GLUCOSE 244* 249* 160*   Electrolytes Recent Labs  Lab 05/05/18 0419 05/05/18 1817 05/06/18 0507 05/07/18 0325  CALCIUM 8.4*  --  9.2 8.8*  MG 2.2 2.1 2.2 2.4  PHOS 3.3 3.4 3.6 4.9*   CBC Recent Labs  Lab 05/05/18 0419 05/06/18 0507 05/07/18 0325  WBC 12.5* 22.0* 20.0*  HGB 10.6* 11.9* 11.1*  HCT 34.9* 39.6 36.9  PLT 223 287 218   ABG Recent Labs  Lab 05/06/18 2146 05/06/18 2226 05/07/18 0412  PHART 7.192* 7.391 7.494*   PCO2ART 90.5* 64.0* 55.6*  PO2ART 131* 120.0* 97.0   Glucose Recent Labs  Lab 05/06/18 0754 05/06/18 1134 05/06/18 1601 05/06/18 1936 05/07/18 0003 05/07/18 0323  GLUCAP 213* 102* 183* 155* 213* 155*   Imaging Dg Swallowing Func-speech Pathology  Result Date: 05/06/2018 Objective Swallowing Evaluation: Type of Study: MBS-Modified Barium Swallow Study  Patient Details Name: Dawn Howe MRN: [id number] Date of Birth: August 07, 1946 Today's Date: 05/06/2018 Time: SLP Start Time (ACUTE ONLY): 1433 -SLP Stop Time (ACUTE ONLY): 1445 SLP Time Calculation (min) (ACUTE ONLY): 12 min Past Medical History: Past Medical History: Diagnosis Date . COPD (chronic obstructive pulmonary disease) ([place])  . Hypertension  . Lung cancer [facility])  Past Surgical History:  The histories are not reviewed yet. Please review them in the "History" navigator section and refresh this [place]. HPI: Pt is a 72 yo female smoker travelling from [place] who presented to [facility] with acute on chronic hypoxic/hypercapnic respiratory failure from Pseudomonal pneumonia in setting of COPD with emphysema, lung cancer, systolic CHF.  ETT 6/27-6/30 (self-extubated), 6/30-7/2, 7/3-7/5. CT chest showed hilar mass with b/l pulmonary artery involvement and possible lymphangitic spread.  Subjective: pt denies difficulty swallowing Assessment / Plan / Recommendation CHL IP CLINICAL IMPRESSIONS 05/06/2018 Clinical Impression Pt presents with oropharyngeal swallow within gross functional limits given age and missing dentition. She has mildly prolonged  mastication and intermittent flash penetration, but with no aspiration despite presentation of large, sequential boluses and mixed consistencies. Recommend continuing with regular diet textures and thin liquids, allowing pt to select foods that are soft enough for her to masticate without her dentures as they are reportedly missing. SLP will f/u briefly for tolerance, likely without need for  additional f/u post-discharge. SLP Visit Diagnosis Dysphagia, unspecified (R13.10) Attention and concentration deficit following -- Frontal lobe and executive function deficit following -- Impact on safety and function Mild aspiration risk   CHL IP TREATMENT RECOMMENDATION 05/06/2018 Treatment Recommendations Therapy as outlined in treatment plan below   Prognosis 05/06/2018 Prognosis for Safe Diet Advancement Good Barriers to Reach Goals -- Barriers/Prognosis Comment -- CHL IP DIET RECOMMENDATION 05/06/2018 SLP Diet Recommendations Regular solids;Thin liquid Liquid Administration via Cup;Straw Medication Administration Whole meds with liquid Compensations Slow rate;Small sips/bites Postural Changes Seated upright at 90 degrees   CHL IP OTHER RECOMMENDATIONS 05/06/2018 Recommended Consults -- Oral Care Recommendations Oral care BID Other Recommendations --   CHL IP FOLLOW UP RECOMMENDATIONS 05/06/2018 Follow up Recommendations None   CHL IP FREQUENCY AND DURATION 05/06/2018 Speech Therapy Frequency (ACUTE ONLY) min 1 x/week Treatment Duration 1 week      CHL IP ORAL PHASE 05/06/2018 Oral Phase WFL Oral - Pudding Teaspoon -- Oral - Pudding Cup -- Oral - Honey Teaspoon -- Oral - Honey Cup -- Oral - Nectar Teaspoon -- Oral - Nectar Cup -- Oral - Nectar Straw -- Oral - Thin Teaspoon -- Oral - Thin Cup -- Oral - Thin Straw -- Oral - Puree -- Oral - Mech Soft -- Oral - Regular -- Oral - Multi-Consistency -- Oral - Pill -- Oral Phase - Comment --  CHL IP PHARYNGEAL PHASE 05/06/2018 Pharyngeal Phase WFL Pharyngeal- Pudding Teaspoon -- Pharyngeal -- Pharyngeal- Pudding Cup -- Pharyngeal -- Pharyngeal- Honey Teaspoon -- Pharyngeal -- Pharyngeal- Honey Cup -- Pharyngeal -- Pharyngeal- Nectar Teaspoon -- Pharyngeal -- Pharyngeal- Nectar Cup -- Pharyngeal -- Pharyngeal- Nectar Straw -- Pharyngeal -- Pharyngeal- Thin Teaspoon -- Pharyngeal -- Pharyngeal- Thin Cup -- Pharyngeal -- Pharyngeal- Thin Straw -- Pharyngeal -- Pharyngeal- Puree --  Pharyngeal -- Pharyngeal- Mechanical Soft -- Pharyngeal -- Pharyngeal- Regular -- Pharyngeal -- Pharyngeal- Multi-consistency -- Pharyngeal -- Pharyngeal- Pill -- Pharyngeal -- Pharyngeal Comment --  CHL IP CERVICAL ESOPHAGEAL PHASE 05/06/2018 Cervical Esophageal Phase WFL Pudding Teaspoon -- Pudding Cup -- Honey Teaspoon -- Honey Cup -- Nectar Teaspoon -- Nectar Cup -- Nectar Straw -- Thin Teaspoon -- Thin Cup -- Thin Straw -- Puree -- Mechanical Soft -- Regular -- Multi-consistency -- Pill -- Cervical Esophageal Comment -- No flowsheet data found. Germain Osgood 05/06/2018, 3:45 PM  Germain Osgood, M.A. CCC-SLP [phone]               STUDIES:  CT angio chest 6/27 [facility]) >> tumor encasement or b/l PA, b/l hilar masses, centrilobular emphysema, septal thickening, scattered nodules Echo 6/30 >> EF 45 to 50%, grade 1 DD, mod LA dilation, mild MR, PAS 36 mmHg  CULTURES: Urine 6/27 >> negative Sputum 6/27 >> Pseudomonas aeruginosa Blood 6/27 >> negative  ANTIBIOTICS: Zithromax 6/27 >> 6/28 Rocephin 6/27 >> 6/28 Fortaz 6/29 >>  SIGNIFICANT EVENTS: 6/27 transfer from [place], cardiology consulted 6/30 self extubated >> stridor >> reintubated, start decadron 7/02 extubated 7/03 wheeze, hypoxia, AMS >> reintubated 7/05 extubate  LINES/TUBES: ETT 6/27 >> 6/30 (self extubation) Lt IJ CVL 6/27 >> Rt radial aline 6/27 >> 7/01 ETT 6/30 >> 7/02  ETT 7/03 >> 7/05  DISCUSSION: 72 yo female smoker with acute on chronic hypoxic/hypercapnic respiratory failure from Pseudomonal pneumonia in setting of COPD with emphysema, lung cancer, systolic CHF.  Self extubated 6/30 with post extubation stridor and reintubated.  Extubated on 7/02.  Developed hypoxia/hypercapnia, wheezing, AMS 7/03 and reintubated.  Improved 7/05 >> will try extubation again.  She would be okay with reintubation and trach if she fails extubation again.  Had respiratory distress in PM of 7/05, but improved after being  placed on Bipap.  ASSESSMENT / PLAN:  Acute on chronic respiratory failure with hypoxia, hypercapnia. COPD exacerbation. COPD with emphysema. Post extubation stridor. Hx of OSA. - oxygen to keep SpO2 90 to 95% - continue solumedrol - Bipap qhs and prn - brovana, pulmicort, incruse, albuterol  Allergic rhinitis. - claritin, singulair, flonase, saline nasal spray  Pseudomonal pneumonia. - completed ABx 7/05  Hx of lung cancer. - d/w her oncologist in [place] >> cancer was from several years ago, and radiographic findings on CT chest 6/27 likely represent new findings - will try to get her stable to discharge and move back to [place] where she can have f/u with pulmonary their for bronchoscopy - if she needs reintubation and trach, then would probably need to arrange for EBUS here  Acute systolic and diastolic CHF. NSTEMI Type II. Hx of HLD. 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 - SSI with levemir  Acute metabolic encephalopathy. - resolved  Hx of depression, anxiety. - Buspar, remeron, paxil  Deconditioning. - PT assessment  DVT prophylaxis - lovenox SUP - protonix Nutrition - carb modified Goals of care - full code.  Pt okay with reintubation and trach if needed.  Updated pt's husband at bedside  Chesley Mires, MD [place] 05/07/2018, 7:29 AM

## 2018-05-07 NOTE — Progress Notes (Signed)
[facility] Progress Note Patient Name: Teletha Petrea DOB: 02/01/1946 MRN: [id number]   Date of Service  05/07/2018  HPI/Events of Note  Hyperglycemia - Patient is on AC/HS resistant Novolog SSI + Levemir. Patient now re-intubated and NPO.  eICU Interventions  Will change AC/HS resistant Novolog SSI to Q 4 hour resistant Novolog SSI.      Intervention Category Major Interventions: Hyperglycemia - active titration of insulin therapy  Sommer,Steven Eugene 05/07/2018, 8:02 PM

## 2018-05-08 LAB — BASIC METABOLIC PANEL
Anion gap: 10 (ref 5–15)
BUN: 28 mg/dL — AB (ref 8–23)
CHLORIDE: 97 mmol/L — AB (ref 98–111)
CO2: 35 mmol/L — AB (ref 22–32)
CREATININE: 0.71 mg/dL (ref 0.44–1.00)
Calcium: 8.2 mg/dL — ABNORMAL LOW (ref 8.9–10.3)
GFR calc non Af Amer: >60 mL/min (ref >60)
Glucose, Bld: 171 mg/dL — ABNORMAL HIGH (ref 70–99)
POTASSIUM: 3.6 mmol/L (ref 3.5–5.1)
Sodium: 142 mmol/L (ref 135–145)

## 2018-05-08 LAB — BLOOD GAS, ARTERIAL
Acid-Base Excess: 11.3 mmol/L — ABNORMAL HIGH (ref 0.0–2.0)
Bicarbonate: 35.5 mmol/L — ABNORMAL HIGH (ref 20.0–28.0)
DRAWN BY: 249101
FIO2: 40
LHR: 12 {breaths}/min
MECHVT: 440 mL
O2 SAT: 97.5 %
PATIENT TEMPERATURE: 97.3
PCO2 ART: 45.9 mmHg (ref 32.0–48.0)
PEEP/CPAP: 5 cmH2O
PH ART: 7.497 — AB (ref 7.350–7.450)
PO2 ART: 91.4 mmHg (ref 83.0–108.0)

## 2018-05-08 LAB — GLUCOSE, CAPILLARY
GLUCOSE-CAPILLARY: 155 mg/dL — AB (ref 70–99)
GLUCOSE-CAPILLARY: 163 mg/dL — AB (ref 70–99)
GLUCOSE-CAPILLARY: 236 mg/dL — AB (ref 70–99)
GLUCOSE-CAPILLARY: 231 mg/dL — AB (ref 70–99)
Glucose-Capillary: 244 mg/dL — ABNORMAL HIGH (ref 70–99)
Glucose-Capillary: 221 mg/dL — ABNORMAL HIGH (ref 70–99)

## 2018-05-08 LAB — MAGNESIUM
Magnesium: 2.3 mg/dL (ref 1.7–2.4)
Magnesium: 2.2 mg/dL (ref 1.7–2.4)

## 2018-05-08 LAB — PHOSPHORUS
Phosphorus: 3.4 mg/dL (ref 2.5–4.6)
Phosphorus: 2.7 mg/dL (ref 2.5–4.6)

## 2018-05-08 MED ORDER — BUSPIRONE HCL 15 MG PO TABS
15.0000 mg | ORAL_TABLET | Freq: Every day | ORAL | Status: DC
  Administered 2018-05-08 – 2018-05-15 (×8): 15 mg
  Filled 2018-05-08 (×8): qty 1

## 2018-05-08 MED ORDER — PRO-STAT SUGAR FREE PO LIQD
30.0000 mL | Freq: Two times a day (BID) | ORAL | Status: DC
  Administered 2018-05-08 – 2018-05-09 (×3): 30 mL
  Filled 2018-05-08 (×3): qty 30

## 2018-05-08 MED ORDER — VITAL HIGH PROTEIN PO LIQD
1000.0000 mL | ORAL | Status: DC
  Administered 2018-05-08: 1000 mL
  Filled 2018-05-08: qty 1000

## 2018-05-08 MED ORDER — FUROSEMIDE 20 MG PO TABS
20.0000 mg | ORAL_TABLET | Freq: Every day | ORAL | Status: DC
  Administered 2018-05-08 – 2018-05-10 (×3): 20 mg
  Filled 2018-05-08 (×3): qty 1

## 2018-05-08 MED ORDER — ACETAMINOPHEN 325 MG PO TABS
650.0000 mg | ORAL_TABLET | ORAL | Status: DC | PRN
  Administered 2018-05-12 – 2018-05-14 (×4): 650 mg
  Filled 2018-05-08 (×5): qty 2

## 2018-05-08 MED ORDER — PAROXETINE HCL 20 MG PO TABS
40.0000 mg | ORAL_TABLET | Freq: Every day | ORAL | Status: DC
  Administered 2018-05-08 – 2018-05-15 (×8): 40 mg
  Filled 2018-05-08 (×9): qty 2

## 2018-05-08 MED ORDER — SODIUM CHLORIDE 0.9 % IV BOLUS
500.0000 mL | Freq: Once | INTRAVENOUS | Status: AC
  Administered 2018-05-08: 500 mL via INTRAVENOUS

## 2018-05-08 MED ORDER — SODIUM CHLORIDE 0.9 % IV BOLUS
1000.0000 mL | Freq: Once | INTRAVENOUS | Status: AC
  Administered 2018-05-08: 1000 mL via INTRAVENOUS

## 2018-05-08 MED ORDER — PANTOPRAZOLE SODIUM 40 MG PO PACK
40.0000 mg | PACK | ORAL | Status: DC
  Administered 2018-05-08 – 2018-05-15 (×8): 40 mg
  Filled 2018-05-08 (×8): qty 20

## 2018-05-08 MED ORDER — MONTELUKAST SODIUM 10 MG PO TABS
10.0000 mg | ORAL_TABLET | Freq: Every day | ORAL | Status: DC
  Administered 2018-05-08 – 2018-05-15 (×8): 10 mg
  Filled 2018-05-08 (×8): qty 1

## 2018-05-08 MED ORDER — IPRATROPIUM-ALBUTEROL 0.5-2.5 (3) MG/3ML IN SOLN
3.0000 mL | Freq: Four times a day (QID) | RESPIRATORY_TRACT | Status: DC
  Administered 2018-05-08 – 2018-05-16 (×32): 3 mL via RESPIRATORY_TRACT
  Filled 2018-05-08 (×32): qty 3

## 2018-05-08 MED ORDER — POLYETHYLENE GLYCOL 3350 17 G PO PACK
17.0000 g | PACK | Freq: Every day | ORAL | Status: DC | PRN
  Administered 2018-05-09 – 2018-05-14 (×2): 17 g
  Filled 2018-05-08 (×2): qty 1

## 2018-05-08 MED ORDER — ATORVASTATIN CALCIUM 20 MG PO TABS
20.0000 mg | ORAL_TABLET | Freq: Every day | ORAL | Status: DC
  Administered 2018-05-08 – 2018-05-15 (×8): 20 mg
  Filled 2018-05-08 (×8): qty 1

## 2018-05-08 MED ORDER — MIRTAZAPINE 15 MG PO TABS
30.0000 mg | ORAL_TABLET | Freq: Every day | ORAL | Status: DC
  Administered 2018-05-08 – 2018-05-15 (×8): 30 mg
  Filled 2018-05-08 (×8): qty 2

## 2018-05-08 MED ORDER — LORATADINE 10 MG PO TABS
10.0000 mg | ORAL_TABLET | Freq: Every day | ORAL | Status: DC
  Administered 2018-05-08 – 2018-05-15 (×8): 10 mg
  Filled 2018-05-08 (×8): qty 1

## 2018-05-08 MED ORDER — SENNOSIDES-DOCUSATE SODIUM 8.6-50 MG PO TABS
1.0000 | ORAL_TABLET | Freq: Two times a day (BID) | ORAL | Status: DC | PRN
  Administered 2018-05-11 – 2018-05-14 (×2): 1
  Filled 2018-05-08 (×2): qty 1

## 2018-05-08 NOTE — Progress Notes (Signed)
[facility] Progress Note Patient Name: Dawn Howe DOB: 1945-11-29 MRN: [id number]   Date of Service  05/08/2018  HPI/Events of Note  Oliguria - Bladder scan with 406 mL residual.   eICU Interventions  Will order: 1. Bolus with 0.9 NaCl 500 mL IV over 31 minutes now.  2. I/O cath PRN.      Intervention Category Intermediate Interventions: Oliguria - evaluation and management  Dominik Lauricella Eugene 05/08/2018, 4:46 AM

## 2018-05-08 NOTE — Progress Notes (Signed)
PULMONARY / CRITICAL CARE MEDICINE   Name: Dawn Howe MRN: [id number] DOB: May 12, 1946    ADMISSION DATE:  04/29/2018  REFERRING MD:  Oval Linsey  CHIEF COMPLAINT:  Short of breath  HISTORY OF PRESENT ILLNESS:   72 yo female smoker travelling from [place] presented to [facility] hospital with dyspnea, and near syncope.  Required intubation for hypoxia/hypercapnia.  CT chest showed hilar mass with b/l pulmonary artery involvement and possible lymphangitic spread.  Echo showed systolic dysfunction.  Found to have Pseudomonal pneumonia and COPD exacerbation.  Has failed several attempts at extubation.  PAST MEDICAL HISTORY :  Lung cancer s/p chemoradiation, COPD on 2 liters, CAD, CHF, HTN.   SUBJECTIVE:  Respiratory distress and reintubated last night.  VITAL SIGNS: BP 118/67   Pulse 87   Temp (!) 97.3 F (36.3 C) (Oral)   Resp (!) 24   Ht 5\' 4"  (1.626 m)   Wt 174 lb 13.2 oz (79.3 kg)   SpO2 99%   BMI 30.01 kg/m   INTAKE / OUTPUT: I/O last 3 completed shifts: In: 2314.8 [P.O.:240; I.V.:549.1; IV Piggyback:1525.7] Out: 2725 [Urine:2725]  PHYSICAL EXAMINATION:  General - alert Eyes - pupils reactive ENT - ETT in place Cardiac - regular, no murmur Chest - faint b/l wheeze Abd - soft, non tender Ext - no edema Skin - no rashes Neuro - RASS 0  LABS:  BMET Recent Labs  Lab 05/06/18 0507 05/07/18 0325 05/08/18 0312  NA 145 147* 142  K 3.7 5.3* 3.6  CL 97* 99 97*  CO2 36* 38* 35*  BUN 40* 39* 28*  CREATININE 0.96 0.91 0.71  GLUCOSE 249* 160* 171*   Electrolytes Recent Labs  Lab 05/05/18 1817 05/06/18 0507 05/07/18 0325 05/08/18 0312  CALCIUM  --  9.2 8.8* 8.2*  MG 2.1 2.2 2.4  --   PHOS 3.4 3.6 4.9*  --    CBC Recent Labs  Lab 05/05/18 0419 05/06/18 0507 05/07/18 0325  WBC 12.5* 22.0* 20.0*  HGB 10.6* 11.9* 11.1*  HCT 34.9* 39.6 36.9  PLT 223 287 218   ABG Recent Labs  Lab 05/07/18 0412 05/07/18 2055 05/08/18 0540  PHART 7.494* 7.583*  7.497*  PCO2ART 55.6* 43.5 45.9  PO2ART 97.0 156.0* 91.4   Glucose Recent Labs  Lab 05/07/18 0817 05/07/18 1159 05/07/18 1619 05/07/18 1942 05/07/18 2330 05/08/18 0327  GLUCAP 124* 214* 185* 239* 183* 155*   Imaging Dg Chest Port 1 View  Result Date: 05/07/2018 CLINICAL DATA:  Intubation EXAM: PORTABLE CHEST 1 VIEW COMPARISON:  05/05/2018, 05/04/2018 FINDINGS: Endotracheal tube tip is about 2.3 cm superior to the carina. Esophageal tube tip is below the diaphragm but non included. Left-sided central venous catheter tip overlies the brachiocephalic confluence. Stable to slight increase interstitial opacity. More confluent areas of airspace disease in the left mid lung and right base. Stable enlarged cardiomediastinal silhouette. Aortic atherosclerosis. No pneumothorax. IMPRESSION: 1. Endotracheal tube tip about 2.3 cm superior to the carina 2. Slight progression of interstitial disease, which may reflect edema on chronic change. 3. Increasing atelectasis or infiltrate at the right base Electronically Signed   By: Donavan Foil M.D.   On: 05/07/2018 18:41   Dg Abd Portable 1v  Result Date: 05/07/2018 CLINICAL DATA:  NG tube EXAM: PORTABLE ABDOMEN - 1 VIEW COMPARISON:  05/04/2018 FINDINGS: Esophageal tube tip and side port overlie the gastric body. Radiopaque contrast within the colon. Small pleural effusions. IMPRESSION: Esophageal tube tip and side port overlie the gastric body. Electronically Signed  By: Donavan Foil M.D.   On: 05/07/2018 18:42     STUDIES:  CT angio chest 6/27 [facility]) >> tumor encasement or b/l PA, b/l hilar masses, centrilobular emphysema, septal thickening, scattered nodules Echo 6/30 >> EF 45 to 50%, grade 1 DD, mod LA dilation, mild MR, PAS 36 mmHg  CULTURES: Urine 6/27 >> negative Sputum 6/27 >> Pseudomonas aeruginosa Blood 6/27 >> negative  ANTIBIOTICS: Zithromax 6/27 >> 6/28 Rocephin 6/27 >> 6/28 Fortaz 6/29 >> 7/05  SIGNIFICANT  EVENTS: 6/27 transfer from [place], cardiology consulted 6/30 self extubated >> stridor >> reintubated, start decadron 7/02 extubated 7/03 wheeze, hypoxia, AMS >> reintubated 7/05 extubate 7/06 reintubated  LINES/TUBES: ETT 6/27 >> 6/30 (self extubation) Lt IJ CVL 6/27 >> Rt radial aline 6/27 >> 7/01 ETT 6/30 >> 7/02 ETT 7/03 >> 7/05 ETT 7/06 >>   DISCUSSION: 72 yo female smoker with acute on chronic hypoxic/hypercapnic respiratory failure from Pseudomonal pneumonia in setting of COPD with emphysema, lung cancer, systolic CHF.  Self extubated 6/30 with post extubation stridor and reintubated.  Extubated on 7/02.  Developed hypoxia/hypercapnia, wheezing, AMS 7/03 and reintubated.  Improved 7/05 >> will try extubation again.  She would be okay with reintubation and trach if she fails extubation again.  She has failed multiple attempts at extubation.  She is agreeable to tracheostomy.  Would need bronchoscopy with EBUS first.  ASSESSMENT / PLAN:  Acute on chronic respiratory failure with hypoxia, hypercapnia. COPD exacerbation. COPD with emphysema. Post extubation stridor. Hx of OSA. - full vent support - oxygen to keep SpO2 90 to 95% - f/u CXR - continue solumedrol - pulmicort, duoneb  Allergic rhinitis. - claritin, singulair, flonase  Pseudomonal pneumonia. - completed ABx 7/05  Hx of lung cancer. - d/w her oncologist in [place] >> cancer was from several years ago, and radiographic findings on CT chest 6/27 likely represent new findings - she will need to have EBUS later this week >> pt and family agreeable  Acute systolic and diastolic CHF. NSTEMI Type II. Hx of HLD. 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 - SSI with levemir  Acute metabolic encephalopathy. Hx of depression, anxiety. - RASS goal 0 to -1 - Buspar, remeron, paxil  DVT prophylaxis - lovenox SUP - protonix Nutrition - tube feeds Goals of  care - full code.  Pt okay with reintubation and trach if needed.  Updated husband at bedside  CC time 105 minutes  Chesley Mires, MD [facility] Pulmonary/Critical Care 05/08/2018, 7:40 AM

## 2018-05-08 NOTE — Progress Notes (Signed)
[facility] Progress Note Patient Name: Dawn Howe DOB: August 01, 1946 MRN: [id number]   Date of Service  05/08/2018  HPI/Events of Note  Oliguria - Residual on bladder scan only 333 mL. CVP = 11.   eICU Interventions  Will bolus with 0.9 NaCl 1 liter IV over 1 hour now.     Intervention Category Intermediate Interventions: Oliguria - evaluation and management  Julicia Krieger Eugene 05/08/2018, 1:01 AM

## 2018-05-09 ENCOUNTER — Inpatient Hospital Stay (HOSPITAL_COMMUNITY): Payer: Medicare Other

## 2018-05-09 ENCOUNTER — Encounter (HOSPITAL_COMMUNITY): Payer: Self-pay | Admitting: General Practice

## 2018-05-09 DIAGNOSIS — R918 Other nonspecific abnormal finding of lung field: Secondary | ICD-10-CM

## 2018-05-09 LAB — CBC
HCT: 33.1 % — ABNORMAL LOW (ref 36.0–46.0)
HEMOGLOBIN: 10 g/dL — AB (ref 12.0–15.0)
MCH: 28.4 pg (ref 26.0–34.0)
MCHC: 30.2 g/dL (ref 30.0–36.0)
MCV: 94 fL (ref 78.0–100.0)
PLATELETS: 190 10*3/uL (ref 150–400)
RBC: 3.52 MIL/uL — AB (ref 3.87–5.11)
RDW: 13.4 % (ref 11.5–15.5)
WBC: 16.2 10*3/uL — ABNORMAL HIGH (ref 4.0–10.5)

## 2018-05-09 LAB — BASIC METABOLIC PANEL
Anion gap: 8 (ref 5–15)
BUN: 31 mg/dL — ABNORMAL HIGH (ref 8–23)
CALCIUM: 8.5 mg/dL — AB (ref 8.9–10.3)
CO2: 35 mmol/L — AB (ref 22–32)
CREATININE: 0.75 mg/dL (ref 0.44–1.00)
Chloride: 99 mmol/L (ref 98–111)
GFR calc non Af Amer: >60 mL/min (ref >60)
GLUCOSE: 251 mg/dL — AB (ref 70–99)
Potassium: 3.6 mmol/L (ref 3.5–5.1)
Sodium: 142 mmol/L (ref 135–145)

## 2018-05-09 LAB — GLUCOSE, CAPILLARY
GLUCOSE-CAPILLARY: 266 mg/dL — AB (ref 70–99)
GLUCOSE-CAPILLARY: 260 mg/dL — AB (ref 70–99)
GLUCOSE-CAPILLARY: 124 mg/dL — AB (ref 70–99)
Glucose-Capillary: 230 mg/dL — ABNORMAL HIGH (ref 70–99)
Glucose-Capillary: 134 mg/dL — ABNORMAL HIGH (ref 70–99)

## 2018-05-09 LAB — PHOSPHORUS
Phosphorus: 3.4 mg/dL (ref 2.5–4.6)
Phosphorus: 3.4 mg/dL (ref 2.5–4.6)

## 2018-05-09 LAB — MAGNESIUM
MAGNESIUM: 2.4 mg/dL (ref 1.7–2.4)
Magnesium: 2.3 mg/dL (ref 1.7–2.4)

## 2018-05-09 MED ORDER — INSULIN DETEMIR 100 UNIT/ML ~~LOC~~ SOLN
20.0000 [IU] | Freq: Every day | SUBCUTANEOUS | Status: DC
  Administered 2018-05-10: 20 [IU] via SUBCUTANEOUS
  Filled 2018-05-09: qty 0.2

## 2018-05-09 MED ORDER — FENTANYL CITRATE (PF) 100 MCG/2ML IJ SOLN
INTRAMUSCULAR | Status: AC
  Filled 2018-05-09: qty 2

## 2018-05-09 MED ORDER — VITAL HIGH PROTEIN PO LIQD: 1000.0000 mL | ORAL | Status: DC

## 2018-05-09 MED ORDER — VITAL HIGH PROTEIN PO LIQD
1000.0000 mL | ORAL | Status: DC
  Administered 2018-05-09: 1000 mL

## 2018-05-09 MED ORDER — PRO-STAT SUGAR FREE PO LIQD
30.0000 mL | Freq: Every day | ORAL | Status: DC
  Administered 2018-05-09 – 2018-05-11 (×8): 30 mL
  Filled 2018-05-09 (×7): qty 30

## 2018-05-09 MED ORDER — FENTANYL CITRATE (PF) 100 MCG/2ML IJ SOLN
100.0000 ug | Freq: Once | INTRAMUSCULAR | Status: AC
  Administered 2018-05-09: 100 ug via INTRAVENOUS

## 2018-05-09 NOTE — Progress Notes (Signed)
Nutrition Follow-up  DOCUMENTATION CODES:   Obesity unspecified  INTERVENTION:   Tube Feeding:  Vital High Protein @ 20 ml/hr Pro-St at 30 mL 5 times daily Provides 117 g of protein, 980 kcals, 403 mL of free water Meets 100% protein needs. Additional kcals from diprivan provided >100% of estimated calories.    NUTRITION DIAGNOSIS:   Inadequate oral intake related to acute illness as evidenced by NPO status.  Being addressed via TF   GOAL:   Provide needs based on ASPEN/SCCM guidelines  Met  MONITOR:   Vent status, TF tolerance, Labs, Weight trends  REASON FOR ASSESSMENT:   Consult, Ventilator Enteral/tube feeding initiation and management  ASSESSMENT:   72 yo female presenting from [facility] where she was intubated in ER for admitted with acute on chronic respiratory failure. Pt transferred to [facility] for probable NSTEMI and cardiology evaluation. Pt with AECOPD, possible postobstructive pneumonia Pt with hx of COPD on 2L [place] at baseline, CAD, HTN, lung cancer s/p radiation and chemo. ECHO with EF 20%  6/30 Self-extubation with re-intubatoin 7/02 Extubated 7/03 Re-intubated 7/05 Extubated 7/06 Re-intubated 7/08 Bronch performed  TF restarted via Adult Tube Feeding Protocol  Patient is currently intubated on ventilator support MV: 11.2 L/min Temp (24hrs), Avg:97.7 F (36.5 C), Min:97.5 F (36.4 C), Max:98.3 F (36.8 C)  Propofol: 34 ml/hr  Plan to utilize weight of 78.1 kg (BMI<30) for estimated needs  Labs:  CBGs 163-266 Meds: remeron, diprivan   Diet Order:   Diet Order           Diet NPO time specified  Diet effective now          EDUCATION NEEDS:   Not appropriate for education at this time  Skin:  Skin Assessment: Reviewed RN Assessment  Last BM:  7/7  Height:   Ht Readings from Last 1 Encounters:  04/29/18 _0  (1.626 m)    Weight:   Wt Readings from Last 1 Encounters:  05/09/18 177 lb 0.5 oz (80.3 kg)     Ideal Body Weight:  54.5 kg  BMI:  Body mass index is 30.39 kg/m.  Estimated Nutritional Needs:   Kcal:  1515 kcals   Protein:  117-156 g  Fluid:  >/= 1.5 L   Kerman Passey MS, RD, LDN, CNSC [phone] Pager  [phone] Weekend/On-Call Pager

## 2018-05-09 NOTE — Progress Notes (Addendum)
Full vent support, acute /chronic resp failure secondary to pna, copd, emphysema, lung cancer , chf, has failed multiple extubations, reintubated on 7/6, plan for trach, s/p bronch today, conts on diprivan.   7/12 Tomi Bamberger RN , BSN - ongoing discussions regarding tracheostomy vs one way extubation, with new dx of recurrent small cell lung cancer.   7/13 Tomi Bamberger RN, BSN - patient has decided on one way extubation on 7/15.

## 2018-05-09 NOTE — Procedures (Signed)
Video Bronchoscopy Procedure Note  Date of Operation: 05/09/2018  Pre-op Diagnosis: Right hilar opacity  Post-op Diagnosis: Same  Surgeon: Baltazar Apo  Assistants: none  Anesthesia: Moderate sedation  Meds Given: fentanyl 100 Mcg, propofol infusion, increased from 50 to 80  Operation: Flexible video fiberoptic bronchoscopy and biopsies.  Estimated Blood Loss: 51WC  Complications: none noted  Indications and History: Dawn Howe is 72 y.o. with history of remote non-small cell lung cancer treated with chemoradiation.  She was found to have a new right hilar opacity and precarinal, subcarinal lymphadenopathy on CT scan of the chest.  Recommendation was to perform video fiberoptic bronchoscopy with biopsies. The risks, benefits, complications, treatment options and expected outcomes were discussed with the patient.  The possibilities of pneumothorax, pneumonia, reaction to medication, pulmonary aspiration, perforation of a viscus, bleeding, failure to diagnose a condition and creating a complication requiring transfusion or operation were discussed with the patient who freely signed the consent.    Description of Procedure: The patient is intubated and sedated on mechanical ventilation in the ICU.  The patient was identified as [name] and the procedure verified as Flexible Video Fiberoptic Bronchoscopy.  A Time Out was held and the above information confirmed.   Her sedation was adjusted as indicated above. The video fiberoptic bronchoscope was introduced via the endotracheal tube and a general inspection was performed which showed normal trachea, normal main carina. The L side was inspected. The LLL, Lingular and LUL airways were normal with the exception of some generalized erythema and some white secretions.  There were no endobronchial lesions noted. The R sided airways were inspected and showed normal BI, RML and RLL.  There was a raised area of mucosa at the orifice of  the right upper lobe medial segment airway.  It was approximately 80% occlusive but instruments would pass.  Endobronchial biopsies and endobronchial brushings were performed at this location to be sent for pathology and cytology respectively.  There was no significant bleeding.  The patient tolerated the procedure well. The bronchoscope was removed. There were no obvious complications.   Samples: 1. Endobronchial brushings from right upper lobe medial segmental airway 2. Endobronchial forceps biopsies from right upper lobe medial segmental airway  Plans:  We will review the cytology, pathology results with the patient and her husband when they become available, utilize this information to coordinate her subsequent care.   Baltazar Apo, MD, PhD 05/09/2018, 1:54 PM [facility] [phone] or if no answer [phone]

## 2018-05-09 NOTE — Progress Notes (Signed)
Video bronchoscopy performed at the bedside.  Intervention bronchial biopsy. Intervention bronchial brushing.  No complications noted.  Will continue to monitor.

## 2018-05-10 DIAGNOSIS — J9622 Acute and chronic respiratory failure with hypercapnia: Secondary | ICD-10-CM

## 2018-05-10 DIAGNOSIS — J9621 Acute and chronic respiratory failure with hypoxia: Secondary | ICD-10-CM

## 2018-05-10 LAB — BASIC METABOLIC PANEL
ANION GAP: 10 (ref 5–15)
BUN: 36 mg/dL — ABNORMAL HIGH (ref 8–23)
CO2: 37 mmol/L — ABNORMAL HIGH (ref 22–32)
Calcium: 8.9 mg/dL (ref 8.9–10.3)
Chloride: 98 mmol/L (ref 98–111)
Creatinine, Ser: 0.74 mg/dL (ref 0.44–1.00)
GFR calc Af Amer: >60 mL/min (ref >60)
GLUCOSE: 124 mg/dL — AB (ref 70–99)
Potassium: 3.3 mmol/L — ABNORMAL LOW (ref 3.5–5.1)
Sodium: 145 mmol/L (ref 135–145)

## 2018-05-10 LAB — TYPE AND SCREEN
ABO/RH(D): O POS
Antibody Screen: NEGATIVE

## 2018-05-10 LAB — CBC
HCT: 35.6 % — ABNORMAL LOW (ref 36.0–46.0)
HEMOGLOBIN: 10.6 g/dL — AB (ref 12.0–15.0)
MCH: 28 pg (ref 26.0–34.0)
MCHC: 29.8 g/dL — ABNORMAL LOW (ref 30.0–36.0)
MCV: 93.9 fL (ref 78.0–100.0)
PLATELETS: 186 10*3/uL (ref 150–400)
RBC: 3.79 MIL/uL — ABNORMAL LOW (ref 3.87–5.11)
RDW: 13.6 % (ref 11.5–15.5)
WBC: 18.4 10*3/uL — AB (ref 4.0–10.5)

## 2018-05-10 LAB — GLUCOSE, CAPILLARY
GLUCOSE-CAPILLARY: 123 mg/dL — AB (ref 70–99)
GLUCOSE-CAPILLARY: 130 mg/dL — AB (ref 70–99)
GLUCOSE-CAPILLARY: 97 mg/dL (ref 70–99)
Glucose-Capillary: 118 mg/dL — ABNORMAL HIGH (ref 70–99)
Glucose-Capillary: 152 mg/dL — ABNORMAL HIGH (ref 70–99)
Glucose-Capillary: 108 mg/dL — ABNORMAL HIGH (ref 70–99)
Glucose-Capillary: 62 mg/dL — ABNORMAL LOW (ref 70–99)
Glucose-Capillary: 61 mg/dL — ABNORMAL LOW (ref 70–99)
Glucose-Capillary: 113 mg/dL — ABNORMAL HIGH (ref 70–99)

## 2018-05-10 LAB — TRIGLYCERIDES: TRIGLYCERIDES: 241 mg/dL — AB (ref <150)

## 2018-05-10 LAB — APTT: APTT: 22 s — AB (ref 24–36)

## 2018-05-10 LAB — PROTIME-INR
INR: 1.02
PROTHROMBIN TIME: 13.3 s (ref 11.4–15.2)

## 2018-05-10 MED ORDER — VITAL HIGH PROTEIN PO LIQD
1000.0000 mL | ORAL | Status: DC
  Administered 2018-05-10 – 2018-05-11 (×2): 1000 mL

## 2018-05-10 MED ORDER — DEXTROSE 50 % IV SOLN
25.0000 mL | Freq: Once | INTRAVENOUS | Status: AC
  Administered 2018-05-10: 25 mL via INTRAVENOUS

## 2018-05-10 MED ORDER — DEXTROSE 50 % IV SOLN
INTRAVENOUS | Status: AC
  Filled 2018-05-10: qty 50

## 2018-05-10 MED ORDER — SODIUM CHLORIDE 0.9% IV SOLUTION: Freq: Once | INTRAVENOUS | Status: AC

## 2018-05-10 MED ORDER — INSULIN DETEMIR 100 UNIT/ML ~~LOC~~ SOLN
10.0000 [IU] | Freq: Every day | SUBCUTANEOUS | Status: DC
  Administered 2018-05-12 – 2018-05-15 (×4): 10 [IU] via SUBCUTANEOUS
  Filled 2018-05-10 (×6): qty 0.1

## 2018-05-10 NOTE — Progress Notes (Signed)
PULMONARY / CRITICAL CARE MEDICINE   Name: Dawn Howe MRN: [id number] DOB: 06-Sep-1946    ADMISSION DATE:  04/15/2018  REFERRING MD:  Oval Linsey  CHIEF COMPLAINT:  Short of breath  HISTORY OF PRESENT ILLNESS:   72 yo female smoker travelling from [place] presented to [facility] hospital with dyspnea, and near syncope.  Required intubation for hypoxia/hypercapnia.  CT chest showed hilar mass with b/l pulmonary artery involvement and possible lymphangitic spread.  Echo showed systolic dysfunction.  Found to have Pseudomonal pneumonia and COPD exacerbation.  Has failed several attempts at extubation.  PAST MEDICAL HISTORY :  Lung cancer s/p chemoradiation, COPD on 2 liters, CAD, CHF, HTN.   SUBJECTIVE:  Respiratory distress and reintubated last night.  VITAL SIGNS: BP (!) 106/57   Pulse 85   Temp (!) 97.4 F (36.3 C) (Oral)   Resp 20   Ht 5\' 4"  (1.626 m)   Wt 177 lb 4 oz (80.4 kg)   SpO2 100%   BMI 30.42 kg/m   INTAKE / OUTPUT: I/O last 3 completed shifts: In: 2258.3 [I.V.:1218.3; NG/GT:1040] Out: 2455 [Urine:2455]  PHYSICAL EXAMINATION:  General - alert Eyes - pupils reactive ENT - ETT in place Cardiac - regular, no murmur Chest - faint b/l wheeze Abd - soft, non tender Ext - no edema Skin - no rashes Neuro - RASS 0  LABS:  BMET Recent Labs  Lab 05/07/18 0325 05/08/18 0312 05/09/18 0414  NA 147* 142 142  K 5.3* 3.6 3.6  CL 99 97* 99  CO2 38* 35* 35*  BUN 39* 28* 31*  CREATININE 0.91 0.71 0.75  GLUCOSE 160* 171* 251*   Electrolytes Recent Labs  Lab 05/07/18 0325 05/08/18 0312  05/08/18 1644 05/09/18 0414 05/09/18 1835  CALCIUM 8.8* 8.2*  --   --  8.5*  --   MG 2.4  --    < > 2.2 2.4 2.3  PHOS 4.9*  --    < > 2.7 3.4 3.4   < > = values in this interval not displayed.   CBC Recent Labs  Lab 05/06/18 0507 05/07/18 0325 05/09/18 0414  WBC 22.0* 20.0* 16.2*  HGB 11.9* 11.1* 10.0*  HCT 39.6 36.9 33.1*  PLT 287 218 190   ABG Recent Labs   Lab 05/07/18 0412 05/07/18 2055 05/08/18 0540  PHART 7.494* 7.583* 7.497*  PCO2ART 55.6* 43.5 45.9  PO2ART 97.0 156.0* 91.4   Glucose Recent Labs  Lab 05/09/18 1204 05/09/18 1558 05/09/18 2008 05/10/18 0001 05/10/18 0342 05/10/18 0751  GLUCAP 260* 134* 124* 123* 118* 152*   Imaging No results found.   STUDIES:  CT angio chest 6/27 [facility]) >> tumor encasement or b/l PA, b/l hilar masses, centrilobular emphysema, septal thickening, scattered nodules Echo 6/30 >> EF 45 to 50%, grade 1 DD, mod LA dilation, mild MR, PAS 36 mmHg  CULTURES: Urine 6/27 >> negative Sputum 6/27 >> Pseudomonas aeruginosa Blood 6/27 >> negative  ANTIBIOTICS: Zithromax 6/27 >> 6/28 Rocephin 6/27 >> 6/28 Fortaz 6/29 >> 7/05  SIGNIFICANT EVENTS: 6/27 transfer from [place], cardiology consulted 6/30 self extubated >> stridor >> reintubated, start decadron 7/02 extubated 7/03 wheeze, hypoxia, AMS >> reintubated 7/05 extubate 7/06 reintubated 7/08 bronchoscopy reveals external compression of subsegmental airway RUL. Biopsied.  LINES/TUBES: ETT 6/27 >> 6/30 (self extubation) Lt IJ CVL 6/27 >> Rt radial aline 6/27 >> 7/01 ETT 6/30 >> 7/02 ETT 7/03 >> 7/05 ETT 7/06 >>   DISCUSSION: 72 yo female smoker with acute on chronic hypoxic/hypercapnic respiratory  failure from Pseudomonal pneumonia in setting of COPD with emphysema, lung cancer, systolic CHF.  Self extubated 6/30 with post extubation stridor and reintubated.  Extubated on 7/02.  Developed hypoxia/hypercapnia, wheezing, AMS 7/03 and reintubated.  Improved 7/05 >> will try extubation again.  She would be okay with reintubation and trach if she fails extubation again.  She has failed multiple attempts at extubation.  She is agreeable to tracheostomy.  .  ASSESSMENT / PLAN:  Acute on chronic respiratory failure with hypoxia, hypercapnia. COPD exacerbation. COPD with emphysema. Post extubation stridor. Hx of OSA. - full vent  support - oxygen to keep SpO2 90 to 95% - f/u CXR - continue solumedrol - pulmicort, duoneb  Allergic rhinitis. - claritin, singulair, flonase  Pseudomonal pneumonia. - completed ABx 7/05  Hx of lung cancer. - d/w her oncologist in [place] >> cancer was from several years ago, and radiographic findings on CT chest 6/27 likely represent new findings - Awaiting records from [facility]. -   Acute systolic and diastolic CHF. NSTEMI Type II. Hx of HLD. 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 - SSI with levemir  Acute metabolic encephalopathy. Hx of depression, anxiety. - RASS goal 0 to -1 - Buspar, remeron, paxil  DVT prophylaxis - lovenox SUP - protonix Nutrition - tube feeds Goals of care - full code.  Pt okay with reintubation and trach if needed.  Updated husband at bedside  CC time 55 minutes  Kipp Brood, MD [facility] ICU Physician [place]  Pager: [phone] Mobile: [phone] After hours: [phone].  05/10/2018, 9:40 AM

## 2018-05-10 NOTE — Progress Notes (Signed)
PULMONARY / CRITICAL CARE MEDICINE   Name: Dawn Howe MRN: [id number] DOB: Jun 13, 1946    ADMISSION DATE:  04/27/2018  REFERRING MD:  Oval Linsey  CHIEF COMPLAINT:  Short of breath  HISTORY OF PRESENT ILLNESS:   72 yo female smoker travelling from [place] presented to [facility] hospital with dyspnea, and near syncope.  Required intubation for hypoxia/hypercapnia.  CT chest showed hilar mass with b/l pulmonary artery involvement and possible lymphangitic spread.  Echo showed systolic dysfunction.  Found to have Pseudomonal pneumonia and COPD exacerbation.  Has failed several attempts at extubation.  PAST MEDICAL HISTORY :  Lung cancer s/p chemoradiation, COPD on 2 liters, CAD, CHF, HTN.   SUBJECTIVE:  Respiratory distress and reintubated 7/6.  VITAL SIGNS: BP (!) 106/57   Pulse 85   Temp (!) 97.4 F (36.3 C) (Oral)   Resp 20   Ht 5\' 4"  (1.626 m)   Wt 177 lb 4 oz (80.4 kg)   SpO2 100%   BMI 30.42 kg/m   INTAKE / OUTPUT: I/O last 3 completed shifts: In: 2258.3 [I.V.:1218.3; NG/GT:1040] Out: 2455 [Urine:2455]  PHYSICAL EXAMINATION:  General - alert Eyes - pupils reactive ENT - ETT in place Cardiac - regular, no murmur Chest - faint b/l wheeze Abd - soft, non tender Ext - no edema Skin - no rashes Neuro - RASS 0 moves all limbs  LABS:  BMET Recent Labs  Lab 05/07/18 0325 05/08/18 0312 05/09/18 0414  NA 147* 142 142  K 5.3* 3.6 3.6  CL 99 97* 99  CO2 38* 35* 35*  BUN 39* 28* 31*  CREATININE 0.91 0.71 0.75  GLUCOSE 160* 171* 251*   Electrolytes Recent Labs  Lab 05/07/18 0325 05/08/18 0312  05/08/18 1644 05/09/18 0414 05/09/18 1835  CALCIUM 8.8* 8.2*  --   --  8.5*  --   MG 2.4  --    < > 2.2 2.4 2.3  PHOS 4.9*  --    < > 2.7 3.4 3.4   < > = values in this interval not displayed.   CBC Recent Labs  Lab 05/06/18 0507 05/07/18 0325 05/09/18 0414  WBC 22.0* 20.0* 16.2*  HGB 11.9* 11.1* 10.0*  HCT 39.6 36.9 33.1*  PLT 287 218 190    ABG Recent Labs  Lab 05/07/18 0412 05/07/18 2055 05/08/18 0540  PHART 7.494* 7.583* 7.497*  PCO2ART 55.6* 43.5 45.9  PO2ART 97.0 156.0* 91.4   Glucose Recent Labs  Lab 05/09/18 1204 05/09/18 1558 05/09/18 2008 05/10/18 0001 05/10/18 0342 05/10/18 0751  GLUCAP 260* 134* 124* 123* 118* 152*   Imaging No results found.   STUDIES:  CT angio chest 6/27 [facility]) >> tumor encasement or b/l PA, b/l hilar masses, centrilobular emphysema, septal thickening, scattered nodules Echo 6/30 >> EF 45 to 50%, grade 1 DD, mod LA dilation, mild MR, PAS 36 mmHg  CULTURES: Urine 6/27 >> negative Sputum 6/27 >> Pseudomonas aeruginosa Blood 6/27 >> negative  ANTIBIOTICS: Zithromax 6/27 >> 6/28 Rocephin 6/27 >> 6/28 Fortaz 6/29 >> 7/05  SIGNIFICANT EVENTS: 6/27 transfer from [place], cardiology consulted 6/30 self extubated >> stridor >> reintubated, start decadron 7/02 extubated 7/03 wheeze, hypoxia, AMS >> reintubated 7/05 extubate 7/06 reintubated 7/08 bronchoscopy reveals external compression of subsegmental airway RUL. Biopsied.  LINES/TUBES: ETT 6/27 >> 6/30 (self extubation) Lt IJ CVL 6/27 >> Rt radial aline 6/27 >> 7/01 ETT 6/30 >> 7/02 ETT 7/03 >> 7/05 ETT 7/06 >>   DISCUSSION: 72 yo female smoker with acute on chronic  hypoxic/hypercapnic respiratory failure from Pseudomonal pneumonia in setting of COPD with emphysema, lung cancer, systolic CHF.  Self extubated 6/30 with post extubation stridor and reintubated.  Extubated on 7/02.  Developed hypoxia/hypercapnia, wheezing, AMS 7/03 and reintubated.  Improved 7/05 >> will try extubation again.  She would be okay with reintubation and trach if she fails extubation again.  She has failed multiple attempts at extubation.  She is agreeable to tracheostomy.  .  ASSESSMENT / PLAN:  Acute on chronic respiratory failure with hypoxia, hypercapnia. COPD exacerbation. COPD with emphysema. Post extubation stridor. Hx  of OSA. - full vent support - oxygen to keep SpO2 90 to 95% - f/u CXR - stop steroids.  - pulmicort, duoneb - for tracheostomy today  Allergic rhinitis. - claritin, singulair, flonase  Pseudomonal pneumonia. - completed ABx 7/05  Hx of lung cancer. - d/w her oncologist in [place] >> cancer was from several years ago, and radiographic findings on CT chest 6/27 likely represent new findings - Awaiting records from [facility]. - Current mass not causing significant airway compromise. - Suspect airway collapse with agitation responsible.  Acute systolic and diastolic CHF. NSTEMI Type II. Hx of HLD. - cardiology s/o 7/06 - continue lipitor - hold diuretic for contraction alkalosis.  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 - SSI with levemir  Acute metabolic encephalopathy. Hx of depression, anxiety. - RASS goal 0 to -1 - Buspar, remeron, paxil  DVT prophylaxis - lovenox SUP - protonix Nutrition - tube feeds Goals of care - full code.  Pt okay with reintubation and trach if needed.  Updated husband at bedside  CRITICAL CARE Performed by: Kipp Brood   Total critical care time: 40 minutes  Critical care time was exclusive of separately billable procedures and treating other patients.  Critical care was necessary to treat or prevent imminent or life-threatening deterioration.  Critical care was time spent personally by me on the following activities: development of treatment plan with patient and/or surrogate as well as nursing, discussions with consultants, evaluation of patient's response to treatment, examination of patient, obtaining history from patient or surrogate, ordering and performing treatments and interventions, ordering and review of laboratory studies, ordering and review of radiographic studies, pulse oximetry and re-evaluation of patient's condition.   Kipp Brood, MD [facility] ICU Physician [place]  Pager: [phone] Mobile:  [phone] After hours: [phone].  05/10/2018, 9:43 AM

## 2018-05-11 ENCOUNTER — Inpatient Hospital Stay (HOSPITAL_COMMUNITY): Payer: Medicare Other

## 2018-05-11 ENCOUNTER — Encounter (HOSPITAL_COMMUNITY): Payer: Medicare Other

## 2018-05-11 DIAGNOSIS — J9600 Acute respiratory failure, unspecified whether with hypoxia or hypercapnia: Secondary | ICD-10-CM

## 2018-05-11 DIAGNOSIS — Z881 Allergy status to other antibiotic agents status: Secondary | ICD-10-CM

## 2018-05-11 DIAGNOSIS — Z923 Personal history of irradiation: Secondary | ICD-10-CM

## 2018-05-11 DIAGNOSIS — Z882 Allergy status to sulfonamides status: Secondary | ICD-10-CM

## 2018-05-11 DIAGNOSIS — J449 Chronic obstructive pulmonary disease, unspecified: Secondary | ICD-10-CM

## 2018-05-11 DIAGNOSIS — Z9221 Personal history of antineoplastic chemotherapy: Secondary | ICD-10-CM

## 2018-05-11 DIAGNOSIS — Z91013 Allergy to seafood: Secondary | ICD-10-CM

## 2018-05-11 LAB — BASIC METABOLIC PANEL
Anion gap: 10 (ref 5–15)
BUN: 32 mg/dL — AB (ref 8–23)
CHLORIDE: 97 mmol/L — AB (ref 98–111)
CO2: 36 mmol/L — ABNORMAL HIGH (ref 22–32)
CREATININE: 0.71 mg/dL (ref 0.44–1.00)
Calcium: 8.3 mg/dL — ABNORMAL LOW (ref 8.9–10.3)
Glucose, Bld: 113 mg/dL — ABNORMAL HIGH (ref 70–99)
POTASSIUM: 3.3 mmol/L — AB (ref 3.5–5.1)
SODIUM: 143 mmol/L (ref 135–145)

## 2018-05-11 LAB — CBC WITH DIFFERENTIAL/PLATELET
ABS IMMATURE GRANULOCYTES: 0.2 10*3/uL — AB (ref 0.0–0.1)
BASOS ABS: 0 10*3/uL (ref 0.0–0.1)
BASOS PCT: 0 %
Eosinophils Absolute: 0.1 10*3/uL (ref 0.0–0.7)
Eosinophils Relative: 1 %
HCT: 34.4 % — ABNORMAL LOW (ref 36.0–46.0)
HEMOGLOBIN: 10.3 g/dL — AB (ref 12.0–15.0)
Immature Granulocytes: 1 %
LYMPHS PCT: 10 %
Lymphs Abs: 1.6 10*3/uL (ref 0.7–4.0)
MCH: 28.3 pg (ref 26.0–34.0)
MCHC: 29.9 g/dL — ABNORMAL LOW (ref 30.0–36.0)
MCV: 94.5 fL (ref 78.0–100.0)
Monocytes Absolute: 0.6 10*3/uL (ref 0.1–1.0)
Monocytes Relative: 4 %
NEUTROS ABS: 13 10*3/uL — AB (ref 1.7–7.7)
NEUTROS PCT: 84 %
PLATELETS: 178 10*3/uL (ref 150–400)
RBC: 3.64 MIL/uL — AB (ref 3.87–5.11)
RDW: 13.7 % (ref 11.5–15.5)
WBC: 15.4 10*3/uL — AB (ref 4.0–10.5)

## 2018-05-11 LAB — GLUCOSE, CAPILLARY
GLUCOSE-CAPILLARY: 126 mg/dL — AB (ref 70–99)
GLUCOSE-CAPILLARY: 151 mg/dL — AB (ref 70–99)
GLUCOSE-CAPILLARY: 107 mg/dL — AB (ref 70–99)
GLUCOSE-CAPILLARY: 129 mg/dL — AB (ref 70–99)
Glucose-Capillary: 124 mg/dL — ABNORMAL HIGH (ref 70–99)
Glucose-Capillary: 98 mg/dL (ref 70–99)

## 2018-05-11 MED ORDER — VECURONIUM BROMIDE 10 MG IV SOLR
INTRAVENOUS | Status: AC
  Filled 2018-05-11: qty 10

## 2018-05-11 MED ORDER — ETOMIDATE 2 MG/ML IV SOLN: 40.0000 mg | Freq: Once | INTRAVENOUS | Status: DC

## 2018-05-11 MED ORDER — LORAZEPAM 2 MG/ML IJ SOLN
1.0000 mg | Freq: Four times a day (QID) | INTRAMUSCULAR | Status: DC | PRN
  Administered 2018-05-13 – 2018-05-16 (×4): 1 mg via INTRAVENOUS
  Filled 2018-05-11 (×4): qty 1

## 2018-05-11 MED ORDER — STERILE WATER FOR INJECTION IJ SOLN
INTRAMUSCULAR | Status: AC
  Filled 2018-05-11: qty 10

## 2018-05-11 MED ORDER — CLONAZEPAM 0.1 MG/ML ORAL SUSPENSION
1.0000 mg | Freq: Two times a day (BID) | ORAL | Status: DC
  Filled 2018-05-11: qty 10

## 2018-05-11 MED ORDER — CLONAZEPAM 0.5 MG PO TBDP
1.0000 mg | ORAL_TABLET | Freq: Two times a day (BID) | ORAL | Status: DC
  Administered 2018-05-11 – 2018-05-13 (×5): 1 mg
  Filled 2018-05-11 (×5): qty 2

## 2018-05-11 MED ORDER — POTASSIUM CHLORIDE 20 MEQ PO PACK
40.0000 meq | PACK | Freq: Once | ORAL | Status: AC
  Administered 2018-05-11: 40 meq via ORAL
  Filled 2018-05-11: qty 2

## 2018-05-11 MED ORDER — VECURONIUM BROMIDE 10 MG IV SOLR: 10.0000 mg | Freq: Once | INTRAVENOUS | Status: DC

## 2018-05-11 MED ORDER — VITAL HIGH PROTEIN PO LIQD
1000.0000 mL | ORAL | Status: DC
  Administered 2018-05-11 – 2018-05-15 (×4): 1000 mL

## 2018-05-11 MED ORDER — MIDAZOLAM HCL 2 MG/2ML IJ SOLN
4.0000 mg | Freq: Once | INTRAMUSCULAR | Status: DC
  Filled 2018-05-11: qty 4

## 2018-05-11 MED ORDER — ETOMIDATE 2 MG/ML IV SOLN
INTRAVENOUS | Status: AC
  Filled 2018-05-11: qty 20

## 2018-05-11 MED ORDER — FENTANYL CITRATE (PF) 100 MCG/2ML IJ SOLN
200.0000 ug | Freq: Once | INTRAMUSCULAR | Status: DC
  Filled 2018-05-11: qty 4

## 2018-05-11 NOTE — Consult Note (Signed)
Reason for the consult:    Lung cancer  HPI: I was asked by Dr. Lynetta Mare to evaluate Dawn Howe for the new diagnosis of small cell lung cancer.  She is a 72 year old woman native of [place] and was visiting in this area before she was hospitalized with respiratory failure.  She has history of COPD as well as lung cancer treated over 10 years ago.  The details of her lung cancer diagnosis is unclear but she underwent definitive therapy with radiation concomitantly with chemotherapy.  She has been disease-free per her report and had a PET scan in the last few months that was unrevealing.  She was transferred from [facility] after presenting with respiratory failure on 04/06/2018.  CT scan of the chest showed a hilar lung mass and bilateral pulmonary artery brush involvement with lymphadenopathy.  She underwent flexible bronchoscopy and a biopsy of her right hilar mass which confirmed the presence of small cell lung cancer.  She remains intubated and difficult to wean.  It was felt her difficulties related to anxiety and no airway compromise from her lung cancer.  Upon my evaluation, she remains intubated but awake and most of the history was provided by her husband that was present bedside.  She denies any discomfort other than her endotracheal tube.  She denies any chest pain or abdominal discomfort.  She denies any back pain.  Review of system was difficult to obtain because of her intubation status.     Past Medical History:  Diagnosis Date  . COPD (chronic obstructive pulmonary disease) ([place])   . Hypertension   . Lung cancer ([place])   :    Current Facility-Administered Medications:  .  0.9 %  sodium chloride infusion, 250 mL, Intravenous, PRN, Corey Harold, NP, Last Rate: 10 mL/hr at 05/11/18 0700 .  acetaminophen (TYLENOL) tablet 650 mg, 650 mg, Per Tube, Q4H PRN, Halford Chessman, Vineet, MD .  albuterol (PROVENTIL) (2.5 MG/3ML) 0.083% nebulizer solution 2.5 mg, 2.5 mg, Nebulization, Q2H  PRN, Corey Harold, NP, 2.5 mg at 05/09/18 1117 .  atorvastatin (LIPITOR) tablet 20 mg, 20 mg, Per Tube, q1800, Chesley Mires, MD, 20 mg at 05/10/18 1714 .  budesonide (PULMICORT) nebulizer solution 0.25 mg, 0.25 mg, Nebulization, BID, Halford Chessman, Vineet, MD, 0.25 mg at 05/11/18 0752 .  busPIRone (BUSPAR) tablet 15 mg, 15 mg, Per Tube, Daily, Chesley Mires, MD, 15 mg at 05/11/18 0942 .  chlorhexidine gluconate (MEDLINE KIT) (PERIDEX) 0.12 % solution 15 mL, 15 mL, Mouth Rinse, BID, Halford Chessman, Vineet, MD, 15 mL at 05/11/18 0744 .  clonazePAM (KLONOPIN) disintegrating tablet 1 mg, 1 mg, Per Tube, BID, Agarwala, Ravi, MD, 1 mg at 05/11/18 0942 .  enoxaparin (LOVENOX) injection 40 mg, 40 mg, Subcutaneous, Q24H, Agarwala, Ravi, MD, 40 mg at 05/10/18 1533 .  etomidate (AMIDATE) 2 MG/ML injection, , , ,  .  etomidate (AMIDATE) injection 40 mg, 40 mg, Intravenous, Once, Agarwala, Ravi, MD .  feeding supplement (VITAL HIGH PROTEIN) liquid 1,000 mL, 1,000 mL, Per Tube, Continuous, Agarwala, Ravi, MD .  fentaNYL (SUBLIMAZE) injection 200 mcg, 200 mcg, Intravenous, Once, Agarwala, Ravi, MD .  fentaNYL (SUBLIMAZE) injection 50 mcg, 50 mcg, Intravenous, Q2H PRN, Chesley Mires, MD, 50 mcg at 05/10/18 2056 .  fluticasone (FLONASE) 50 MCG/ACT nasal spray 1 spray, 1 spray, Each Nare, Daily, Chesley Mires, MD, 1 spray at 05/11/18 0950 .  insulin aspart (novoLOG) injection 0-20 Units, 0-20 Units, Subcutaneous, Q4H, Anders Simmonds, MD, 3 Units at 05/11/18 1159 .  insulin detemir (LEVEMIR) injection 10 Units, 10 Units, Subcutaneous, Daily, Kipp Brood, MD, Stopped at 05/11/18 [phone]  ipratropium-albuterol (DUONEB) 0.5-2.5 (3) MG/3ML nebulizer solution 3 mL, 3 mL, Nebulization, Q6H, Sood, Vineet, MD, 3 mL at 05/11/18 1300 .  loratadine (CLARITIN) tablet 10 mg, 10 mg, Per Tube, Daily, Chesley Mires, MD, 10 mg at 05/11/18 0943 .  LORazepam (ATIVAN) injection 1 mg, 1 mg, Intravenous, Q6H PRN, Agarwala, Ravi, MD .  MEDLINE mouth  rinse, 15 mL, Mouth Rinse, 10 times per day, Chesley Mires, MD, 15 mL at 05/11/18 1154 .  midazolam (VERSED) injection 4 mg, 4 mg, Intravenous, Once, Agarwala, Ravi, MD .  mirtazapine (REMERON) tablet 30 mg, 30 mg, Per Tube, QHS, Chesley Mires, MD, 30 mg at 05/10/18 2202 .  montelukast (SINGULAIR) tablet 10 mg, 10 mg, Per Tube, QHS, Chesley Mires, MD, 10 mg at 05/10/18 2202 .  pantoprazole sodium (PROTONIX) 40 mg/20 mL oral suspension 40 mg, 40 mg, Per Tube, Q24H, Sood, Vineet, MD, 40 mg at 05/11/18 0800 .  PARoxetine (PAXIL) tablet 40 mg, 40 mg, Per Tube, Daily, Chesley Mires, MD, 40 mg at 05/11/18 0943 .  polyethylene glycol (MIRALAX / GLYCOLAX) packet 17 g, 17 g, Per Tube, Daily PRN, Chesley Mires, MD, 17 g at 05/09/18 1814 .  propofol (DIPRIVAN) 1000 MG/100ML infusion, 5-80 mcg/kg/min, Intravenous, Titrated, Sampson Goon, MD, Last Rate: 14.5 mL/hr at 05/11/18 1405, 30 mcg/kg/min at 05/11/18 1405 .  senna-docusate (Senokot-S) tablet 1 tablet, 1 tablet, Per Tube, BID PRN, Chesley Mires, MD, 1 tablet at 05/11/18 1153 .  sodium chloride (OCEAN) 0.65 % nasal spray 1 spray, 1 spray, Each Nare, BID, Chesley Mires, MD, 1 spray at 05/11/18 0950 .  vecuronium (NORCURON) 10 MG injection, , , ,  .  vecuronium (NORCURON) injection 10 mg, 10 mg, Intravenous, Once, Kipp Brood, MD:  Allergies  Allergen Reactions  . Doxycycline Other (See Comments)    unknown  . Shellfish Allergy Other (See Comments)    unknown  . Sulfa Antibiotics Other (See Comments)    unknown  :  No family history on file.:  Social History   Socioeconomic History  . Marital status: Married    Spouse name: Not on file  . Number of children: Not on file  . Years of education: Not on file  . Highest education level: Not on file  Occupational History  . Not on file  Social Needs  . Financial resource strain: Not on file  . Food insecurity:    Worry: Not on file    Inability: Not on file  . Transportation needs:    Medical:  Not on file    Non-medical: Not on file  Tobacco Use  . Smoking status: Not on file  Substance and Sexual Activity  . Alcohol use: Not on file  . Drug use: Not on file  . Sexual activity: Not on file  Lifestyle  . Physical activity:    Days per week: Not on file    Minutes per session: Not on file  . Stress: Not on file  Relationships  . Social connections:    Talks on phone: Not on file    Gets together: Not on file    Attends religious service: Not on file    Active member of club or organization: Not on file    Attends meetings of clubs or organizations: Not on file    Relationship status: Not on file  . Intimate partner violence:  Fear of current or ex partner: Not on file    Emotionally abused: Not on file    Physically abused: Not on file    Forced sexual activity: Not on file  Other Topics Concern  . Not on file  Social History Narrative  . Not on file  :  Pertinent items are noted in HPI.  Exam: Blood pressure 99/68, pulse 82, temperature 99.4 F (37.4 C), temperature source Axillary, resp. rate (!) 22, height _0  (1.626 m), weight 173 lb 11.6 oz (78.8 kg), SpO2 99 %.   General appearance: Intubated appeared without distress. Head: atraumatic without any abnormalities. Eyes: conjunctivae/corneas clear. PERRL.  Sclera anicteric. Throat: lips, mucosa, and tongue normal; without oral thrush or ulcers. Resp: Expiratory wheezes and rhonchi noted in all lung fields. Cardio: regular rate and rhythm, S1, S2 normal, no murmur, click, rub or gallop GI: soft, non-tender; bowel sounds normal; no masses,  no organomegaly Skin: Skin color, texture, turgor normal. No rashes or lesions Lymph nodes: Cervical, supraclavicular, and axillary nodes normal. Neurologic: Grossly normal without any deficits. Musculoskeletal: No joint deformity or effusion.  Recent Labs    05/10/18 1030 05/11/18 0535  WBC 18.4* 15.4*  HGB 10.6* 10.3*  HCT 35.6* 34.4*  PLT 186 178   Recent  Labs    05/10/18 1030 05/11/18 0535  NA 145 143  K 3.3* 3.3*  CL 98 97*  CO2 37* 36*  GLUCOSE 124* 113*  BUN 36* 32*  CREATININE 0.74 0.71  CALCIUM 8.9 8.3*        Assessment and Plan:   72 year old woman with the following:  1.  Small cell lung cancer diagnosed via bronchoscopy and a biopsy on 05/09/2018.  She presented with a hilar mass and lung adenopathy and possible lymphangitic spread.  No abdominal imaging has been obtained at this time to complete the staging.  This is in the setting of a acute respiratory failure and a previous history of non-small cell lung cancer diagnosed over 10 years ago.  These findings were discussed today with the patient, her husband and her daughter that were present at bedside.  The natural course of this malignancy was discussed today in detail including treatment options.  The aggressive nature of this malignancy was also reviewed also the expediency of treating this condition was also emphasized.  Her options were reviewed today which include to major approaches:  An aggressive approach which include proceeding with systemic chemotherapy in the immediate future before or after tracheostomy in an effort to control and palliate her disease.  She understands that there is a small chance of cure and higher chance of complications given her debilitation and acute illness.  She also understands that chemotherapy has to be given her multiple cycles and she will have a long way of recovery if she is to recover at all.  They also understand that without treatment, this cancer will continue to spread and will cause further debilitation, deterioration in overall will result in death.  This might still happen even with treatment given the aggressive nature of this malignancy.  Alternatively, palliative and supportive care approach only without any aggressive measures was also discussed.  Despite this cancer is highly responsive to systemic chemotherapy, she still  faces long odds of recovery and unlikely remission from her cancer.  This approach will entails comfort measures only moving forward.   I urged the family to discuss these options and will continue to follow closely in the next 24 to 68  hours regarding their decision.  2.  Respiratory failure: It does not appear that her primary lung neoplasm is contributing to her inability to wean at this time.  Management is per the ICU team.  3.  Prognosis this was discussed today as well.  Her prognosis is poor given her advanced malignancy, debilitation and overall weakness.   80 minutes was spent with the patient face-to-face today and on the floor.  More than 50% of time was dedicated to patient counseling, education discussing her diagnosis and prognosis. Also reviewing records and obtaining history from the patient's family given the patient's incapacitation.

## 2018-05-11 NOTE — Progress Notes (Addendum)
Nutrition Follow-up  DOCUMENTATION CODES:   Obesity unspecified  INTERVENTION:   Once Tube Feeding re-started:  Vital High Protein @ 45 ml/hr  Provides 95 g of protein, 1080 kcals (1462 kcal with propofol), 903 mL of free water. Meets 100% of needs.   NUTRITION DIAGNOSIS:   Inadequate oral intake related to acute illness as evidenced by NPO status.  Ongoing  GOAL:   Provide needs based on ASPEN/SCCM guidelines  Met  MONITOR:   Vent status, TF tolerance, Labs, Weight trends  REASON FOR ASSESSMENT:   Consult, Ventilator Enteral/tube feeding initiation and management  ASSESSMENT:   72 yo female presenting from [facility] where she was intubated in ER for admitted with acute on chronic respiratory failure. Pt transferred to [facility] for probable NSTEMI and cardiology evaluation. Pt with AECOPD, possible postobstructive pneumonia Pt with hx of COPD on 2L [place] at baseline, CAD, HTN, lung cancer s/p radiation and chemo. ECHO with EF 20%  6/30 Self-extubation with re-intubatoin 7/02 Extubated 7/03 Re-intubated 7/05 Extubated 7/06 Re-intubated 7/08 Bronch performed reveals external compression of subsegmental airway RUL. Biopsied.  Pt for trach placement today. TF on hold until after procedure.  Weaning propofol. Nutrition requirements and TF adjusted.  Weight noted to decrease 4 lb since last RD visit on 7/8 (177-173 lb) No BM recorded in 3 days. Recommend bowel regimen.   Patient is currently intubated on ventilator support MV: 8.8 L/min Temp (24hrs), Avg:98.4 F (36.9 C), Min:97.7 F (36.5 C), Max:98.9 F (37.2 C)  Propofol: 14.5 ml/hr- provides 382 kcal  I/O: -7838 ml since admit UOP: 1000 ml x 24 hrs  Medications reviewed and include: fentanyl, versed, Remeron Labs reviewed: K 3.3 (L)   Diet Order:   Diet Order           Diet NPO time specified  Diet effective midnight        Diet NPO time specified  Diet effective now          EDUCATION  NEEDS:   Not appropriate for education at this time  Skin:  Skin Assessment: Reviewed RN Assessment  Last BM:  05/08/18  Height:   Ht Readings from Last 1 Encounters:  04/29/18 '5\' 4"'$  (1.626 m)    Weight:   Wt Readings from Last 1 Encounters:  05/11/18 173 lb 11.6 oz (78.8 kg)    Ideal Body Weight:  54.5 kg  BMI:  Body mass index is 29.82 kg/m.  Estimated Nutritional Needs:   Kcal:  1410 kcal  Protein:  75-95 g  Fluid:  >/= 1.5 L/day    Mariana Single RD, LDN Clinical Nutrition Pager # - [phone]

## 2018-05-11 NOTE — Progress Notes (Signed)
PULMONARY / CRITICAL CARE MEDICINE   Name: Dawn Howe MRN: [id number] DOB: 03-10-1946    ADMISSION DATE:  04/27/2018  REFERRING MD:  [facility].  CHIEF COMPLAINT:  Short of breath  HISTORY OF PRESENT ILLNESS:   72 yo female smoker travelling from [place] presented to [facility] hospital with dyspnea, and near syncope.  Required intubation for hypoxia/hypercapnia.  CT chest showed hilar mass with b/l pulmonary artery involvement and possible lymphangitic spread.  Echo showed systolic dysfunction.  Found to have Pseudomonal pneumonia and COPD exacerbation.  Has failed several attempts at extubation.  PAST MEDICAL HISTORY :  Lung cancer s/p chemoradiation, COPD on 2 liters, CAD, CHF, HTN.   SUBJECTIVE:  Respiratory distress and reintubated 7/6.  VITAL SIGNS: BP (!) 99/54   Pulse 89   Temp 98.7 F (37.1 C) (Oral)   Resp (!) 22   Ht 5\' 4"  (1.626 m)   Wt 173 lb 11.6 oz (78.8 kg)   SpO2 99%   BMI 29.82 kg/m   INTAKE / OUTPUT: I/O last 3 completed shifts: In: 2477.2 [I.V.:1237.2; NG/GT:1240] Out: 1660 [YTKZS:0109; Emesis/NG output:1000]  PHYSICAL EXAMINATION:  General - alert Eyes - pupils reactive ENT - ETT in place Cardiac - regular, no murmur Chest - bilateral rhonchi.  Abd - soft, non tender Ext - no edema Skin - no rashes Neuro - RASS 0 moves all limbs  LABS:  BMET Recent Labs  Lab 05/09/18 0414 05/10/18 1030 05/11/18 0535  NA 142 145 143  K 3.6 3.3* 3.3*  CL 99 98 97*  CO2 35* 37* 36*  BUN 31* 36* 32*  CREATININE 0.75 0.74 0.71  GLUCOSE 251* 124* 113*   Electrolytes Recent Labs  Lab 05/08/18 1644 05/09/18 0414 05/09/18 1835 05/10/18 1030 05/11/18 0535  CALCIUM  --  8.5*  --  8.9 8.3*  MG 2.2 2.4 2.3  --   --   PHOS 2.7 3.4 3.4  --   --    CBC Recent Labs  Lab 05/09/18 0414 05/10/18 1030 05/11/18 0535  WBC 16.2* 18.4* 15.4*  HGB 10.0* 10.6* 10.3*  HCT 33.1* 35.6* 34.4*  PLT 190 186 178   ABG Recent Labs  Lab 05/07/18 0412  05/07/18 2055 05/08/18 0540  PHART 7.494* 7.583* 7.497*  PCO2ART 55.6* 43.5 45.9  PO2ART 97.0 156.0* 91.4   Glucose Recent Labs  Lab 05/10/18 1707 05/10/18 1805 05/10/18 1935 05/10/18 2325 05/11/18 0313 05/11/18 0745  GLUCAP 61* 113* 130* 97 126* 151*   Imaging No results found.   STUDIES:  CT angio chest 6/27 [facility]) >> tumor encasement or b/l PA, b/l hilar masses, centrilobular emphysema, septal thickening, scattered nodules Echo 6/30 >> EF 45 to 50%, grade 1 DD, mod LA dilation, mild MR, PAS 36 mmHg  CULTURES: Urine 6/27 >> negative Sputum 6/27 >> Pseudomonas aeruginosa Blood 6/27 >> negative  ANTIBIOTICS: Zithromax 6/27 >> 6/28 Rocephin 6/27 >> 6/28 Fortaz 6/29 >> 7/05  SIGNIFICANT EVENTS: 6/27 transfer from [place], cardiology consulted 6/30 self extubated >> stridor >> reintubated, start decadron 7/02 extubated 7/03 wheeze, hypoxia, AMS >> reintubated 7/05 extubate 7/06 reintubated 7/08 bronchoscopy reveals external compression of subsegmental airway RUL. Biopsied.  LINES/TUBES: ETT 6/27 >> 6/30 (self extubation) Lt IJ CVL 6/27 >> Rt radial aline 6/27 >> 7/01 ETT 6/30 >> 7/02 ETT 7/03 >> 7/05 ETT 7/06 >>   DISCUSSION: 72 yo female smoker with acute on chronic hypoxic/hypercapnic respiratory failure from Pseudomonal pneumonia in setting of COPD with emphysema, lung cancer, systolic CHF.  Self extubated 6/30 with post extubation stridor and reintubated.  Extubated on 7/02.  Developed hypoxia/hypercapnia, wheezing, AMS 7/03 and reintubated.  Improved 7/05 >> will try extubation again.  She would be okay with reintubation and trach if she fails extubation again.  She has failed multiple attempts at extubation.  She is agreeable to tracheostomy.  .  ASSESSMENT / PLAN:  Acute on chronic respiratory failure with hypoxia, hypercapnia. COPD exacerbation. COPD with emphysema. Post extubation stridor. Hx of OSA. - full vent support - oxygen to  keep SpO2 90 to 95% - f/u CXR - stop steroids.  - pulmicort, duoneb - for tracheostomy today  Allergic rhinitis. - claritin, singulair, flonase  Pseudomonal pneumonia. - completed ABx 7/05  Hx of lung cancer. - d/w her oncologist in [place] >> cancer was from several years ago, and radiographic findings on CT chest 6/27 likely represent new findings - Awaiting records from [facility]. - Current mass not causing significant airway compromise. - Suspect airway collapse with agitation responsible.  Acute systolic and diastolic CHF. NSTEMI Type II. Hx of HLD. - cardiology s/o 7/06 - continue lipitor - hold diuretic for contraction alkalosis.  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 - SSI with levemir  Acute metabolic encephalopathy. Hx of depression, anxiety. - RASS goal 0 to -1 - Buspar, remeron, paxil  DVT prophylaxis - lovenox SUP - protonix Nutrition - tube feeds Goals of care - full code.  Pt okay with reintubation and trach if needed.  Updated husband at bedside  CRITICAL CARE Performed by: Kipp Brood   Total critical care time: 40 minutes  Critical care time was exclusive of separately billable procedures and treating other patients.  Critical care was necessary to treat or prevent imminent or life-threatening deterioration.  Critical care was time spent personally by me on the following activities: development of treatment plan with patient and/or surrogate as well as nursing, discussions with consultants, evaluation of patient's response to treatment, examination of patient, obtaining history from patient or surrogate, ordering and performing treatments and interventions, ordering and review of laboratory studies, ordering and review of radiographic studies, pulse oximetry and re-evaluation of patient's condition.   Kipp Brood, MD [facility] ICU Physician [place]  Pager: [phone] Mobile: [phone] After hours:  [phone].  05/11/2018, 10:17 AM

## 2018-05-12 DIAGNOSIS — C3490 Malignant neoplasm of unspecified part of unspecified bronchus or lung: Secondary | ICD-10-CM

## 2018-05-12 DIAGNOSIS — Z9911 Dependence on respirator [ventilator] status: Secondary | ICD-10-CM

## 2018-05-12 DIAGNOSIS — Z7189 Other specified counseling: Secondary | ICD-10-CM

## 2018-05-12 DIAGNOSIS — Z515 Encounter for palliative care: Secondary | ICD-10-CM | POA: Insufficient documentation

## 2018-05-12 LAB — CBC WITH DIFFERENTIAL/PLATELET
Abs Immature Granulocytes: 0.2 10*3/uL — ABNORMAL HIGH (ref 0.0–0.1)
BASOS ABS: 0 10*3/uL (ref 0.0–0.1)
BASOS PCT: 0 %
EOS ABS: 0.1 10*3/uL (ref 0.0–0.7)
EOS PCT: 1 %
HCT: 34.7 % — ABNORMAL LOW (ref 36.0–46.0)
Hemoglobin: 10.3 g/dL — ABNORMAL LOW (ref 12.0–15.0)
Immature Granulocytes: 1 %
Lymphocytes Relative: 8 %
Lymphs Abs: 1.3 10*3/uL (ref 0.7–4.0)
MCH: 28.3 pg (ref 26.0–34.0)
MCHC: 29.7 g/dL — AB (ref 30.0–36.0)
MCV: 95.3 fL (ref 78.0–100.0)
MONO ABS: 0.6 10*3/uL (ref 0.1–1.0)
Monocytes Relative: 4 %
NEUTROS ABS: 14.6 10*3/uL — AB (ref 1.7–7.7)
Neutrophils Relative %: 86 %
PLATELETS: 180 10*3/uL (ref 150–400)
RBC: 3.64 MIL/uL — ABNORMAL LOW (ref 3.87–5.11)
RDW: 14.1 % (ref 11.5–15.5)
WBC: 16.9 10*3/uL — ABNORMAL HIGH (ref 4.0–10.5)

## 2018-05-12 LAB — GLUCOSE, CAPILLARY
GLUCOSE-CAPILLARY: 157 mg/dL — AB (ref 70–99)
GLUCOSE-CAPILLARY: 100 mg/dL — AB (ref 70–99)
Glucose-Capillary: 126 mg/dL — ABNORMAL HIGH (ref 70–99)
Glucose-Capillary: 103 mg/dL — ABNORMAL HIGH (ref 70–99)
Glucose-Capillary: 104 mg/dL — ABNORMAL HIGH (ref 70–99)

## 2018-05-12 LAB — BASIC METABOLIC PANEL
Anion gap: 7 (ref 5–15)
BUN: 22 mg/dL (ref 8–23)
CO2: 33 mmol/L — ABNORMAL HIGH (ref 22–32)
CREATININE: 0.58 mg/dL (ref 0.44–1.00)
Calcium: 8.3 mg/dL — ABNORMAL LOW (ref 8.9–10.3)
Chloride: 100 mmol/L (ref 98–111)
GFR calc Af Amer: >60 mL/min (ref >60)
GLUCOSE: 155 mg/dL — AB (ref 70–99)
Potassium: 3.9 mmol/L (ref 3.5–5.1)
SODIUM: 140 mmol/L (ref 135–145)

## 2018-05-12 MED ORDER — DEXMEDETOMIDINE HCL IN NACL 400 MCG/100ML IV SOLN
0.0000 ug/kg/h | INTRAVENOUS | Status: DC
  Administered 2018-05-12 – 2018-05-13 (×2): 0.4 ug/kg/h via INTRAVENOUS
  Filled 2018-05-12 (×2): qty 100

## 2018-05-12 MED ORDER — IBUPROFEN 100 MG/5ML PO SUSP
200.0000 mg | Freq: Four times a day (QID) | ORAL | Status: DC | PRN
  Administered 2018-05-13 – 2018-05-14 (×4): 200 mg
  Filled 2018-05-12 (×4): qty 10

## 2018-05-12 MED ORDER — IBUPROFEN 100 MG/5ML PO SUSP
200.0000 mg | Freq: Four times a day (QID) | ORAL | Status: DC | PRN
  Administered 2018-05-12: 200 mg via ORAL
  Filled 2018-05-12: qty 10

## 2018-05-12 NOTE — Progress Notes (Signed)
72 year old smoker with COPD on 2 L and a prior history of lung cancer status post chemoradiation, cancer free now for 6 years, admitted with Pseudomonas pneumonia and COPD exacerbation requiring mechanical ventilation. She was also found to have hilar mass and bronchoscopy showed obstruction of the right upper lobe orifice and biopsy showed small cell lung cancer.  She has failed extubation x3. On exam-awake and alert, follows commands, coarse breath sounds bilateral, no rhonchi, S1-S2 normal, soft and nontender abdomen, no edema or JVD.  Chest x-ray reviewed which does not show any infiltrates. Labs show mild leukocytosis with normal electrolytes  Impression/plan  Acute on chronic respiratory failure with hypoxia and hypercarbia-she was treated with 7 days of ceftazidime for Pseudomonas pneumonia. COPD exacerbation has been treated with steroids and bronchodilators.  But she remains ventilator dependent and has failed extubation x3  Next course of action would be tracheostomy, goals of care discussion in progress palliative care involved and I provided my input from a prognosis standpoint.  New diagnosis of small cell lung cancer -  some obstruction of the RUL orifice noted during bronchoscopy, but no evidence of atelectasis on CT scan hence I do not think that radiation will improve her  chances of liberating from the ventilator  Steroid-induced hyperglycemia-continue Levemir with SSI resistant scale  The patient is critically ill with multiple organ systems failure and requires high complexity decision making for assessment and support, frequent evaluation and titration of therapies, application of advanced monitoring technologies and extensive interpretation of multiple databases. Critical Care Time devoted to patient care services described in this note independent of APP/resident  time is 31 minutes.   Leanna Sato Elsworth Soho MD

## 2018-05-12 NOTE — Progress Notes (Signed)
Palliative:  GOC discussion initiated this morning. Husband at bedside very tearful (seems less inclined for aggressive care) but seems to want patient to make decisions. Dawn Howe is awake and alert and will nod head yes/no to questions and indicates she has a headache. However, she is unable to answer questions or make decisions regarding aggressiveness of care at this time. Husband would like to have more time for them to consider and talk as a family before proceeding with tracheostomy at this time. I will follow up later today when hopefully daughter present as well and continue conversation. 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 Full note to follow.   Vinie Sill, NP Palliative Medicine Team Pager # [phone] (M-F 8a-5p) Team Phone # [phone] (Nights/Weekends)

## 2018-05-12 NOTE — Consult Note (Addendum)
Consultation Note Date: 05/12/2018   Patient Name: Dawn Howe  DOB: 1946-09-18  MRN: [id number]  Age / Sex: 72 y.o., female  PCP: Patient, No Pcp Per Referring Physician: Kipp Brood, MD  Reason for Consultation: Establishing goals of care  HPI/Patient Profile: 72 y.o. female  with past medical history of h/o left sided lung cancer s/p chemo/radiation (remission past 6 yrs), CHF, CAD, HTN, COPD 2L at home admitted on 04/27/2018 with respiratory failure. She had traveled from her home in [place] to a house in [place] she inherited from her sister. Hospitalization complicated by unable to wean from vent (intubated 4 times in 2 weeks). Also newly diagnosed SCLC. Palliative requested to assist with GOC as patient and family faced with many difficult decisions in face of poor prognosis.   Clinical Assessment and Goals of Care: I met today with Dawn Howe and her husband at bedside. Dawn Howe is intubated but fully alert and able to express her understanding. I introduced palliative care and further discussed diagnosis of SCLC and limited good options for her. We discussed aggressive care with trach/chemo vs comfort care path. Dawn Howe is awake and alert and will nod head yes/no to questions and indicates she has a headache. However, she is unable to answer questions or make decisions regarding aggressiveness of care at this time. Husband would like to have more time for them to consider and talk as a family before proceeding with tracheostomy at this time. I will follow up later today when hopefully daughter present as well and continue conversation. 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   Late entry: I returned to Dawn Howe's bedside in the afternoon and at bedside is husband and daughter, [name]. Dawn Howe is still alert for me. We further discussed options in more detail of  what this means in light of poor prognosis with aggressive cancer. Dawn Howe was able to write to communicate further and asks questions such as "how long?" I explained that it is difficult to say as I fear she is unlikely to tolerate chemotherapy given her acute debility and weakness and this could lead to increased illness, worsening QOL, and death itself. Husband shares that they were told weeks prognosis. We discussed that with more comfort path and coming off ventilator we could utilize medications to better treat her SOB and anxiety but prognosis could be days to weeks depending on her breathing.   Dawn Howe is more engaged in discussion this afternoon but has been asleep most of the day and family would like more time to discuss privately as a family.. I offered emotional support. Encouraged them to call for any questions/concerns or anything we can do to help them through this.   Primary Decision Maker PATIENT - intubated but able to communicate clearly (husband and daughter included in conversation)    SUMMARY OF RECOMMENDATIONS   - Considering desires for trach/chemo vs comfort care  Code Status/Advance Care Planning:  Full code - did not specifically discuss today   Symptom Management:  Headache: Tylenol prn. Added ibuprofen prn as well.   Sedation and other per PCCM.   Palliative Prophylaxis:   Bowel Regimen, Delirium Protocol, Frequent Pain Assessment, Oral Care and Turn Reposition  Psycho-social/Spiritual:   Desire for further Chaplaincy support:yes  Additional Recommendations: Compassionate Wean Education, Education on Hospice and Grief/Bereavement Support  Prognosis:   Very poor overall but dependent on aggressiveness of care.   Discharge Planning: To Be Determined      Primary Diagnoses: Present on Admission: **None**   I have reviewed the medical record, interviewed the patient and family, and examined the patient. The following aspects are  pertinent.  Past Medical History:  Diagnosis Date  . COPD (chronic obstructive pulmonary disease) ([place])   . Hypertension   . Lung cancer [facility])    Social History   Socioeconomic History  . Marital status: Married    Spouse name: Not on file  . Number of children: Not on file  . Years of education: Not on file  . Highest education level: Not on file  Occupational History  . Not on file  Social Needs  . Financial resource strain: Not on file  . Food insecurity:    Worry: Not on file    Inability: Not on file  . Transportation needs:    Medical: Not on file    Non-medical: Not on file  Tobacco Use  . Smoking status: Not on file  Substance and Sexual Activity  . Alcohol use: Not on file  . Drug use: Not on file  . Sexual activity: Not on file  Lifestyle  . Physical activity:    Days per week: Not on file    Minutes per session: Not on file  . Stress: Not on file  Relationships  . Social connections:    Talks on phone: Not on file    Gets together: Not on file    Attends religious service: Not on file    Active member of club or organization: Not on file    Attends meetings of clubs or organizations: Not on file    Relationship status: Not on file  Other Topics Concern  . Not on file  Social History Narrative  . Not on file   No family history on file. Scheduled Meds: . atorvastatin  20 mg Per Tube q1800  . budesonide (PULMICORT) nebulizer solution  0.25 mg Nebulization BID  . busPIRone  15 mg Per Tube Daily  . chlorhexidine gluconate (MEDLINE KIT)  15 mL Mouth Rinse BID  . clonazepam  1 mg Per Tube BID  . enoxaparin (LOVENOX) injection  40 mg Subcutaneous Q24H  . etomidate  40 mg Intravenous Once  . fentaNYL (SUBLIMAZE) injection  200 mcg Intravenous Once  . fluticasone  1 spray Each Nare Daily  . insulin aspart  0-20 Units Subcutaneous Q4H  . insulin detemir  10 Units Subcutaneous Daily  . ipratropium-albuterol  3 mL Nebulization Q6H  . loratadine  10 mg  Per Tube Daily  . mouth rinse  15 mL Mouth Rinse 10 times per day  . midazolam  4 mg Intravenous Once  . mirtazapine  30 mg Per Tube QHS  . montelukast  10 mg Per Tube QHS  . pantoprazole sodium  40 mg Per Tube Q24H  . PARoxetine  40 mg Per Tube Daily  . sodium chloride  1 spray Each Nare BID  . vecuronium  10 mg Intravenous Once   Continuous Infusions: . sodium chloride 10 mL/hr at 05/12/18  0800  . feeding supplement (VITAL HIGH PROTEIN) Stopped (05/12/18 0830)  . propofol (DIPRIVAN) infusion 5 mcg/kg/min (05/12/18 0800)   PRN Meds:.sodium chloride, acetaminophen, albuterol, fentaNYL (SUBLIMAZE) injection, LORazepam, polyethylene glycol, senna-docusate Allergies  Allergen Reactions  . Doxycycline Other (See Comments)    unknown  . Shellfish Allergy Other (See Comments)    unknown  . Sulfa Antibiotics Other (See Comments)    unknown   Review of Systems  Unable to perform ROS: Intubated  Neurological: Positive for headaches.    Physical Exam  Constitutional: She is oriented to person, place, and time. She appears well-developed. She is intubated.  HENT:  Head: Normocephalic and atraumatic.  Cardiovascular: Normal rate and regular rhythm.  Pulmonary/Chest: No accessory muscle usage. No tachypnea. She is intubated. No respiratory distress.  Abdominal: Normal appearance.  Neurological: She is alert and oriented to person, place, and time.  Nursing note and vitals reviewed.   Vital Signs: BP 129/71   Pulse 93   Temp 99.5 F (37.5 C) (Oral)   Resp (!) 21   Ht '5\' 4"'$  (1.626 m)   Wt 78.9 kg (173 lb 15.1 oz)   SpO2 100%   BMI 29.86 kg/m  Pain Scale: CPOT   Pain Score: 4    SpO2: SpO2: 100 % O2 Device:SpO2: 100 % O2 Flow Rate: .O2 Flow Rate (L/min): 3 L/min  IO: Intake/output summary:   Intake/Output Summary (Last 24 hours) at 05/12/2018 0958 Last data filed at 05/12/2018 0830 Gross per 24 hour  Intake 1672.45 ml  Output 1735 ml  Net -62.55 ml    LBM: Last BM  Date: 05/08/18 Baseline Weight: Weight: 85.2 kg (187 lb 13.3 oz) Most recent weight: Weight: 78.9 kg (173 lb 15.1 oz)     Palliative Assessment/Data: 30%    Time Total: 70 min  Greater than 50%  of this time was spent counseling and coordinating care related to the above assessment and plan.  Signed by: Vinie Sill, NP Palliative Medicine Team Pager # [phone] (M-F 8a-5p) Team Phone # [phone] (Nights/Weekends)

## 2018-05-12 NOTE — Progress Notes (Signed)
PULMONARY / CRITICAL CARE MEDICINE   Name: Dawn Howe MRN: [id number] DOB: 1946/06/07    ADMISSION DATE:  04/20/2018  REFERRING MD:  [facility].  CHIEF COMPLAINT:  Short of breath  HISTORY OF PRESENT ILLNESS:   72 yo female smoker travelling from [place] presented to [facility] hospital with dyspnea, and near syncope.  Required intubation for hypoxia/hypercapnia.  CT chest showed hilar mass with b/l pulmonary artery involvement and possible lymphangitic spread.  Echo showed systolic dysfunction.  Found to have Pseudomonal pneumonia and COPD exacerbation.  Has failed several attempts at extubation.  PAST MEDICAL HISTORY :  Lung cancer s/p chemoradiation, COPD on 2 liters, CAD, CHF, HTN.   SUBJECTIVE:  Comfortable, sedated on ventilator  VITAL SIGNS: Blood Pressure (Abnormal) 102/56   Pulse 93   Temperature 98.6 F (37 C) (Oral)   Respiration 16   Height 5\' 4"  (1.626 m)   Weight 173 lb 15.1 oz (78.9 kg)   Oxygen Saturation 96%   Body Mass Index 29.86 kg/m   INTAKE / OUTPUT: I/O last 3 completed shifts: In: 2326.8 [I.V.:1169.7; NG/GT:1157.2] Out: 2935 [Urine:2935]  PHYSICAL EXAMINATION: General: This is a chronically ill-appearing 72 year old white female she is currently resting comfortably on the ventilator she is sedated on a propofol infusion HEENT normocephalic atraumatic orally intubated no jugular venous distention mucous membranes moist Pulmonary: Expiratory wheeze, equal chest rise on mechanically assisted breath. Cardiac: Regular rate and rhythm Abdomen: Soft organomegaly Extremities: Brisk capillary refill trace dependent edema strong pulses Neuro: Sedated, will open her eyes and intermittently follow commands. LABS:  BMET Recent Labs  Lab 05/10/18 1030 05/11/18 0535 05/12/18 0500  NA 145 143 140  K 3.3* 3.3* 3.9  CL 98 97* 100  CO2 37* 36* 33*  BUN 36* 32* 22  CREATININE 0.74 0.71 0.58  GLUCOSE 124* 113* 155*   Electrolytes Recent Labs  Lab  05/08/18 1644 05/09/18 0414 05/09/18 1835 05/10/18 1030 05/11/18 0535 05/12/18 0500  CALCIUM  --  8.5*  --  8.9 8.3* 8.3*  MG 2.2 2.4 2.3  --   --   --   PHOS 2.7 3.4 3.4  --   --   --    CBC Recent Labs  Lab 05/10/18 1030 05/11/18 0535 05/12/18 0500  WBC 18.4* 15.4* 16.9*  HGB 10.6* 10.3* 10.3*  HCT 35.6* 34.4* 34.7*  PLT 186 178 180   ABG Recent Labs  Lab 05/07/18 0412 05/07/18 2055 05/08/18 0540  PHART 7.494* 7.583* 7.497*  PCO2ART 55.6* 43.5 45.9  PO2ART 97.0 156.0* 91.4   Glucose Recent Labs  Lab 05/11/18 1527 05/11/18 2017 05/11/18 2331 05/12/18 0406 05/12/18 0758 05/12/18 1140  GLUCAP 107* 129* 98 157* 126* 103*   Imaging Dg Abd Portable 1v  Result Date: 05/11/2018 CLINICAL DATA:  OG tube placement. EXAM: PORTABLE ABDOMEN - 1 VIEW COMPARISON:  05/07/2018 FINDINGS: OG tube tip is in the distal body of the stomach. The stomach is not distended. There is contrast throughout the nondistended colon. No dilated small bowel. No appreciable free air. No acute bone abnormality. IMPRESSION: OG tube appears in good position in the distal stomach. Benign-appearing abdomen. Contrast remains in the colon but has moved slightly distally since 05/07/2018. Electronically Signed   By: Lorriane Shire M.D.   On: 05/11/2018 15:34     STUDIES:  CT angio chest 6/27 [facility]) >> tumor encasement or b/l PA, b/l hilar masses, centrilobular emphysema, septal thickening, scattered nodules Echo 6/30 >> EF 45 to 50%, grade 1  DD, mod LA dilation, mild MR, PAS 36 mmHg  CULTURES: Urine 6/27 >> negative Sputum 6/27 >> Pseudomonas aeruginosa Blood 6/27 >> negative  ANTIBIOTICS: Zithromax 6/27 >> 6/28 Rocephin 6/27 >> 6/28 Fortaz 6/29 >> 7/05  SIGNIFICANT EVENTS: 6/27 transfer from [place], cardiology consulted 6/30 self extubated >> stridor >> reintubated, start decadron 7/02 extubated 7/03 wheeze, hypoxia, AMS >> reintubated 7/05 extubate 7/06 reintubated 7/08  bronchoscopy reveals external compression of subsegmental airway RUL. Biopsied.  LINES/TUBES: ETT 6/27 >> 6/30 (self extubation) Lt IJ CVL 6/27 >> Rt radial aline 6/27 >> 7/01 ETT 6/30 >> 7/02 ETT 7/03 >> 7/05 ETT 7/06 >>   DISCUSSION: 72 yo female smoker with acute on chronic hypoxic/hypercapnic respiratory failure from Pseudomonal pneumonia in setting of COPD with emphysema, lung cancer, systolic CHF.  Self extubated 6/30 with post extubation stridor and reintubated.  Extubated on 7/02.  Developed hypoxia/hypercapnia, wheezing, AMS 7/03 and reintubated.  Improved 7/05 >> will try extubation again.  She would be okay with reintubation and trach if she fails extubation again.  Reintubated on 7 6.  Underwent bronchoscopy 7/8 biopsies obtained demonstrated small cell lung cancer.  Oncology is now consulted.  Family leaning towards palliative care given poor tolerance of chemotherapy in the past  ASSESSMENT / PLAN:  Acute on chronic respiratory failure with hypoxia, hypercapnia in setting of pseudomonas PNA and AECOPD (underlying severe emphysema)  Post-extubation stridor OSA  -Completed 7 days of ceftazidime -Failed several extubation attempts due to post extubation stridor -With new diagnosis of recurrent small cell lung cancer family does not want to proceed with tracheostomy which will really leave [place] with no other choice other than one-way extubation Plan Continue current supportive care including mechanical ventilation Given diagnosis of small cell lung cancer family does not want to proceed with tracheostomy Continue scheduled bronchodilators PAD protocol RASS goal 0 to -1 Changing propofol to Precedex Continue PRN fentanyl   Hx of lung cancer now with diagnosis of small cell lung cancer obtained by bronchoscopy on 7/8 - d/w her oncologist in [place] >> cancer was from several years ago, and radiographic findings on CT chest 6/27 likely represent new findings - [facility]waiting records  [facility]. - Current mass not causing significant airway compromise. - Suspect airway collapse with agitation responsible. Plan Will likely transition to palliative care, pending further discussion with patient and family as well as palliative care medicine  Allergic rhinitis. Plan Continuing Claritin, Singulair, and Flonase   Acute metabolic encephalopathy. Hx of depression, anxiety. -This seems to have improved.  She is still sedated on propofol infusion Plan Transition to Precedex RASS goal 0 Continue BuSpar, Remeron, and Paxil  Acute systolic and diastolic CHF. NSTEMI Type II. Hx of HLD. - cardiology s/o 7/06 Plan Continuing Lipitor Continue telemetry Holding diuresis  Intermittent fluid and electrolyte imbalance. 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 -Glycemic control acceptable Plan Continue sliding scale insulin with Levemir as well - SSI with levemir    DVT prophylaxis - lovenox SUP - protonix Nutrition - tube feeds Goals of care - full code.  Pt okay with reintubation and trach if needed.  Now working on goals of care given new diagnosis of recurrent small cell lung cancer likely transition to one-way extubation and DO NOT RESUSCITATE Updated husband at bedside     Erick Colace ACNP-BC [place] Pager # [phone] OR #  [phone] if no answer

## 2018-05-12 NOTE — Progress Notes (Signed)
Pt has orders to be made NPO on 7/11 at 0000.  Pt found with continuous feeds running, made NPO at 0830.  Will notify MD.  Delorise Jackson, RN

## 2018-05-13 DIAGNOSIS — Z515 Encounter for palliative care: Secondary | ICD-10-CM

## 2018-05-13 LAB — BASIC METABOLIC PANEL
Anion gap: 6 (ref 5–15)
BUN: 17 mg/dL (ref 8–23)
CALCIUM: 8.2 mg/dL — AB (ref 8.9–10.3)
CHLORIDE: 103 mmol/L (ref 98–111)
CO2: 30 mmol/L (ref 22–32)
CREATININE: 0.64 mg/dL (ref 0.44–1.00)
GFR calc Af Amer: >60 mL/min (ref >60)
GFR calc non Af Amer: >60 mL/min (ref >60)
GLUCOSE: 99 mg/dL (ref 70–99)
Potassium: 3.9 mmol/L (ref 3.5–5.1)
Sodium: 139 mmol/L (ref 135–145)

## 2018-05-13 LAB — CBC WITH DIFFERENTIAL/PLATELET
Abs Immature Granulocytes: 0.1 10*3/uL (ref 0.0–0.1)
Basophils Absolute: 0 10*3/uL (ref 0.0–0.1)
Basophils Relative: 0 %
EOS ABS: 0.2 10*3/uL (ref 0.0–0.7)
Eosinophils Relative: 1 %
HEMATOCRIT: 33.5 % — AB (ref 36.0–46.0)
HEMOGLOBIN: 9.9 g/dL — AB (ref 12.0–15.0)
IMMATURE GRANULOCYTES: 1 %
LYMPHS ABS: 1 10*3/uL (ref 0.7–4.0)
Lymphocytes Relative: 7 %
MCH: 28 pg (ref 26.0–34.0)
MCHC: 29.6 g/dL — ABNORMAL LOW (ref 30.0–36.0)
MCV: 94.9 fL (ref 78.0–100.0)
MONO ABS: 0.6 10*3/uL (ref 0.1–1.0)
MONOS PCT: 4 %
NEUTROS PCT: 87 %
Neutro Abs: 12.9 10*3/uL — ABNORMAL HIGH (ref 1.7–7.7)
Platelets: 192 10*3/uL (ref 150–400)
RBC: 3.53 MIL/uL — ABNORMAL LOW (ref 3.87–5.11)
RDW: 13.9 % (ref 11.5–15.5)
WBC: 14.8 10*3/uL — ABNORMAL HIGH (ref 4.0–10.5)

## 2018-05-13 LAB — GLUCOSE, CAPILLARY
Glucose-Capillary: 103 mg/dL — ABNORMAL HIGH (ref 70–99)
Glucose-Capillary: 113 mg/dL — ABNORMAL HIGH (ref 70–99)
Glucose-Capillary: 127 mg/dL — ABNORMAL HIGH (ref 70–99)
Glucose-Capillary: 84 mg/dL (ref 70–99)
Glucose-Capillary: 93 mg/dL (ref 70–99)
Glucose-Capillary: 96 mg/dL (ref 70–99)

## 2018-05-13 MED ORDER — DEXMEDETOMIDINE HCL IN NACL 400 MCG/100ML IV SOLN
0.4000 ug/kg/h | INTRAVENOUS | Status: DC
  Administered 2018-05-13 – 2018-05-14 (×2): 0.4 ug/kg/h via INTRAVENOUS
  Administered 2018-05-14: 0.2 ug/kg/h via INTRAVENOUS
  Administered 2018-05-15: 0.6 ug/kg/h via INTRAVENOUS
  Administered 2018-05-15: 1 ug/kg/h via INTRAVENOUS
  Administered 2018-05-16: 0.6 ug/kg/h via INTRAVENOUS
  Filled 2018-05-13 (×6): qty 100

## 2018-05-13 MED ORDER — FENTANYL CITRATE (PF) 100 MCG/2ML IJ SOLN
25.0000 ug | INTRAMUSCULAR | Status: DC | PRN
  Administered 2018-05-13 – 2018-05-15 (×9): 25 ug via INTRAVENOUS
  Filled 2018-05-13 (×10): qty 2

## 2018-05-13 MED ORDER — CLONAZEPAM 0.5 MG PO TBDP
0.5000 mg | ORAL_TABLET | Freq: Two times a day (BID) | ORAL | Status: DC
  Administered 2018-05-13 – 2018-05-15 (×5): 0.5 mg
  Filled 2018-05-13 (×5): qty 1

## 2018-05-13 NOTE — Progress Notes (Signed)
PULMONARY / CRITICAL CARE MEDICINE   Name: Dawn Howe MRN: [id number] DOB: [birth year]    ADMISSION DATE:  05/01/2018  REFERRING MD:  [facility].  CHIEF COMPLAINT:  Short of breath  HISTORY OF PRESENT ILLNESS:   72 yo female smoker travelling from [place] presented to [facility] hospital with dyspnea, and near syncope.  Required intubation for hypoxia/hypercapnia.  CT chest showed hilar mass with b/l pulmonary artery involvement and possible lymphangitic spread.  Echo showed systolic dysfunction.  Found to have Pseudomonal pneumonia and COPD exacerbation.  Has failed several attempts at extubation.  PAST MEDICAL HISTORY :  Lung cancer s/p chemoradiation, COPD on 2 liters, CAD, CHF, HTN.   SUBJECTIVE:  Sedated on vent & precedex  VITAL SIGNS: Blood Pressure 91/62   Pulse 89   Temperature 99.9 F (37.7 C) (Oral)   Respiration (Abnormal) 22   Height 5\' 4"  (1.626 m)   Weight 171 lb 4.8 oz (77.7 kg)   Oxygen Saturation 97%   Body Mass Index 29.40 kg/m   INTAKE / OUTPUT: I/O last 3 completed shifts: In: 1475.7 [I.V.:823.2; NG/GT:652.5] Out: 2920 [Urine:2920]  PHYSICAL EXAMINATION: General: 72 year old female currently sedated heavily on the ventilator.  She is on relatively low doses of Precedex, difficult to know if this is Precedex or simply the demand of weaning HEENT normocephalic atraumatic no jugular venous distention she is orally intubated mucous membranes are moist Pulmonary: Mechanically assisted breath.  Scattered wheezing.  Tidal volumes in the 390 to  400 cc range on pressure support ventilation of 12 cmH2O Cardiac: Regular rate and rhythm without murmur rub or gallop Extremities: Warm and dry, brisk capillary refill, no edema, strong pulses. Neuro: Sedated, intermittently interactive.  Family states more sleepy today than past. LABS:  BMET Recent Labs  Lab 05/11/18 0535 05/12/18 0500 05/13/18 0500  NA 143 140 139  K 3.3* 3.9 3.9  CL 97* 100 103  CO2  36* 33* 30  BUN 32* 22 17  CREATININE 0.71 0.58 0.64  GLUCOSE 113* 155* 99   Electrolytes Recent Labs  Lab 05/08/18 1644 05/09/18 0414 05/09/18 1835  05/11/18 0535 05/12/18 0500 05/13/18 0500  CALCIUM  --  8.5*  --    < > 8.3* 8.3* 8.2*  MG 2.2 2.4 2.3  --   --   --   --   PHOS 2.7 3.4 3.4  --   --   --   --    < > = values in this interval not displayed.   CBC Recent Labs  Lab 05/11/18 0535 05/12/18 0500 05/13/18 0500  WBC 15.4* 16.9* 14.8*  HGB 10.3* 10.3* 9.9*  HCT 34.4* 34.7* 33.5*  PLT 178 180 192   ABG Recent Labs  Lab 05/07/18 0412 05/07/18 2055 05/08/18 0540  PHART 7.494* 7.583* 7.497*  PCO2ART 55.6* 43.5 45.9  PO2ART 97.0 156.0* 91.4   Glucose Recent Labs  Lab 05/12/18 1140 05/12/18 1535 05/12/18 1959 05/12/18 2355 05/13/18 0429 05/13/18 0751  GLUCAP 103* 104* 100* 93 96 84   Imaging No results found.   STUDIES:  CT angio chest 6/27 [facility]) >> tumor encasement or b/l PA, b/l hilar masses, centrilobular emphysema, septal thickening, scattered nodules Echo 6/30 >> EF 45 to 50%, grade 1 DD, mod LA dilation, mild MR, PAS 36 mmHg  CULTURES: Urine 6/27 >> negative Sputum 6/27 >> Pseudomonas aeruginosa Blood 6/27 >> negative  ANTIBIOTICS: Zithromax 6/27 >> 6/28 Rocephin 6/27 >> 6/28 Fortaz 6/29 >> 7/05  SIGNIFICANT EVENTS: 6/27 transfer  from [place], cardiology consulted 6/30 self extubated >> stridor >> reintubated, start decadron 7/02 extubated 7/03 wheeze, hypoxia, AMS >> reintubated 7/05 extubate 7/06 reintubated 7/08 bronchoscopy reveals external compression of subsegmental airway RUL. Biopsied.  LINES/TUBES: ETT 6/27 >> 6/30 (self extubation) Lt IJ CVL 6/27 >> Rt radial aline 6/27 >> 7/01 ETT 6/30 >> 7/02 ETT 7/03 >> 7/05 ETT 7/06 >>   DISCUSSION: 72 yo female smoker with acute on chronic hypoxic/hypercapnic respiratory failure from Pseudomonal pneumonia in setting of COPD with emphysema, lung cancer, systolic  CHF.  Self extubated 6/30 with post extubation stridor and reintubated.  Extubated on 7/02.  Developed hypoxia/hypercapnia, wheezing, AMS 7/03 and reintubated.  Improved 7/05 >> will try extubation again.  Reintubated on 7/6.  Underwent bronchoscopy 7/8 biopsies obtained demonstrated small cell lung cancer.  Oncology is was consulted.  Family now waffling.  Initially thought we would be transitioning towards comfort, however they feel she would want to continue being aggressive per their interactions with her over the last 24 hours.  I explained to them the dots doubtful that her performance status would currently make her a candidate for chemotherapy however tracheostomy would not be unreasonable simply to liberate her from the ventilator.  Although I am not sure it is reasonable to assume that tracheostomy would be a guaranteed bridge to chemotherapy given her underlying performance status.  For now we will continue supportive care.  We will discontinue Precedex and changed to low-dose PRN fentanyl.  We will continue to discuss goals of care with patient.  If she is leaning towards ileostomy I doubt will build to get to this before Monday   ASSESSMENT / PLAN:  Acute on chronic respiratory failure with hypoxia, hypercapnia in setting of pseudomonas PNA and AECOPD (underlying severe emphysema)  Post-extubation stridor OSA  -Completed 7 days of ceftazidime -Failed several extubation attempts due to post extubation stridor -With new diagnosis of recurrent small cell lung cancer family does not want to proceed with tracheostomy which will really leave [place] with no other choice other than one-way extubation Plan Cont current full vent support w/ daily pressure support ventilation PAD protocol RASS goal 0 We will discontinue Precedex, changed to PRN fentanyl only Ongoing discussions regarding tracheostomy versus one-way extubation   Hx of lung cancer now with diagnosis of small cell lung cancer obtained  by bronchoscopy on 7/8 - d/w her oncologist in [place] >> cancer was from several years ago, and radiographic findings on CT chest 6/27 likely represent new findings - [facility]waiting [facility]. - Current mass not causing significant airway compromise. - Suspect airway collapse with agitation responsible. Plan Cont palliative care discussions, I do not believe her performance level would currently qualify her to be a candidate for chemotherapy   Allergic rhinitis. Plan Cont Claritin, Cont Singulair, Cont flonase   Acute metabolic encephalopathy. Hx of depression, anxiety. -This seems to have improved.  She is still sedated on propofol infusion Plan Cont precedex Cont Buspar, remeron and paxil    Acute systolic and diastolic CHF. NSTEMI Type II. Hx of HLD. - cardiology s/o 7/06 Plan Cont lipitor Cont tele  Holding diuresis    Intermittent fluid and electrolyte imbalance. 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 -Glycemic control acceptable Plan ssi and Levemir     DVT prophylaxis - lovenox SUP - protonix Nutrition - tube feeds Goals of care - full code.  Pt okay with reintubation and trach if needed.  Now working on goals of care given new diagnosis of recurrent small cell lung cancer likely transition to one-way extubation and DO NOT RESUSCITATE Updated husband at bedside     Erick Colace ACNP-BC [place] Pager # [phone] OR # [phone] if no answer

## 2018-05-13 NOTE — Progress Notes (Signed)
Daily Progress Note   Patient Name: Dawn Howe       Date: 05/13/2018 DOB: [birth year]  Age: 72 y.o. MRN#: [id number] Attending Physician: Kipp Brood, MD Primary Care Physician: Patient, No Pcp Per Admit Date: 04/25/2018  Reason for Consultation/Follow-up: Establishing goals of care  Subjective:  patient remains intubated, opens eyes briefly is able to follow along somewhat but not able to fully participate, see below:  Length of Stay: 15  Current Medications: Scheduled Meds:  . atorvastatin  20 mg Per Tube q1800  . budesonide (PULMICORT) nebulizer solution  0.25 mg Nebulization BID  . busPIRone  15 mg Per Tube Daily  . chlorhexidine gluconate (MEDLINE KIT)  15 mL Mouth Rinse BID  . clonazepam  0.5 mg Per Tube BID  . enoxaparin (LOVENOX) injection  40 mg Subcutaneous Q24H  . etomidate  40 mg Intravenous Once  . fentaNYL (SUBLIMAZE) injection  200 mcg Intravenous Once  . fluticasone  1 spray Each Nare Daily  . insulin aspart  0-20 Units Subcutaneous Q4H  . insulin detemir  10 Units Subcutaneous Daily  . ipratropium-albuterol  3 mL Nebulization Q6H  . loratadine  10 mg Per Tube Daily  . mouth rinse  15 mL Mouth Rinse 10 times per day  . midazolam  4 mg Intravenous Once  . mirtazapine  30 mg Per Tube QHS  . montelukast  10 mg Per Tube QHS  . pantoprazole sodium  40 mg Per Tube Q24H  . PARoxetine  40 mg Per Tube Daily  . sodium chloride  1 spray Each Nare BID  . vecuronium  10 mg Intravenous Once    Continuous Infusions: . sodium chloride 10 mL/hr at 05/13/18 1154  . feeding supplement (VITAL HIGH PROTEIN) Stopped (05/12/18 0830)    PRN Meds: sodium chloride, acetaminophen, albuterol, fentaNYL (SUBLIMAZE) injection, ibuprofen, LORazepam, polyethylene glycol,  senna-docusate  Physical Exam         Opens eyes, awake Endotracheal tube Orogastric tube S1-S2 Abdomen is not distended next line patient does not have edema Anterior breath sounds clear bilaterally  Vital Signs: BP (!) 77/50   Pulse 78   Temp 98.7 F (37.1 C) (Oral)   Resp 18   Ht _0  (1.626 m)   Wt 77.7 kg (171 lb 4.8 oz)   SpO2 98%  BMI 29.40 kg/m  SpO2: SpO2: 98 % O2 Device: O2 Device: Ventilator O2 Flow Rate: O2 Flow Rate (L/min): 3 L/min  Intake/output summary:   Intake/Output Summary (Last 24 hours) at 05/13/2018 1159 Last data filed at 05/13/2018 1154 Gross per 24 hour  Intake 442.82 ml  Output 1150 ml  Net -707.18 ml   LBM: Last BM Date: 05/08/18 Baseline Weight: Weight: 85.2 kg (187 lb 13.3 oz) Most recent weight: Weight: 77.7 kg (171 lb 4.8 oz) Palliative performance scale 20%      Palliative Assessment/Data:      Patient Active Problem List   Diagnosis Date Noted  . SCLC (small cell lung carcinoma) ([place])   . Ventilator dependence ([place])   . Goals of care, counseling/discussion   . Palliative care encounter   . Hilar mass   . Demand ischemia ([place])   . Acute pulmonary edema ([facility])   . Hypokalemia   . SEMI (subendocardial myocardial infarction) ([place])   . Acute systolic congestive heart failure ([place])   . Acute respiratory failure with hypoxia [facility])     Palliative Care Assessment & Plan   Patient Profile:    Assessment:  72 year old lady with COPD, prior history of lung cancer, admitted now with ventilator dependent respiratory failure, Pseudomonas pneumonia, COPD exacerbation. Patient remains intubated and mechanically ventilated. Hospital course is significant for the fact that the patient has not been able to be extubated from the ventilator 3. Additionally, imaging has shown hilar mass obstruction of the right upper lobe biopsy showing small cell lung cancer.   Palliative medicine consultation has been requested for further goals of care  discussions. Initial consultation was completed on 05-12-18.    Recommendations/Plan:   family meeting: I met with the patient, who is intubated and ventilated. Husband and daughter are present at the bedside. Brief life review performed. The patient is originally from [place]. She inherited a house from one of her family members in [place], [place] recently. Hence, she was in [place] of this house when she felt sick was admitted at [facility] and was subsequently transferred to [facility] in [place], [place].  Patient has been seen and evaluated by medical oncology. She is being followed closely by pulmonary critical care medicine colleagues.At this time, goals of care decisions needed to be discussed about next steps - tracheostomy/ chemotherapy versus focus on comfort measures only.   Patient opens her eyes and is able to blink her eyes yes/no to some very simple questions. Otherwise, she is not able to participate in full discussions and decision making. Family would like for the patient to make the next decision herself.   I did discuss with them about one-way extubation, or no reintubation whenever the patient is safely able to be extubated this time, aggressive symptom management and initiation of comfort measures. Briefly also discussed about hospice philosophy of care. Family awaiting patient participation for further decision-making.   Goals of Care and Additional Recommendations:  Limitations on Scope of Treatment: Full Scope Treatment  Code Status:    Code Status Orders  (From admission, onward)        Start     Ordered   04/22/2018 1605  Full code  Continuous     04/25/2018 1607    Code Status History    This patient has a current code status but no historical code status.       Prognosis:   Unable to determine  Discharge Planning:  To  Be Determined  Care plan was discussed with  Patient ( who did not  participate much), husband and daughter at bedside. Also discussed with pulmonary critical care medicine colleague Marni Griffon,  NP  Thank you for allowing the Palliative Medicine Team to assist in the care of this patient.   Time In: 10 Time Out: 10.25 Total Time 25 Prolonged Time Billed  no       Greater than 50%  of this time was spent counseling and coordinating care related to the above assessment and plan.  Loistine Chance, MD [phone]  Please contact Palliative Medicine Team phone at [phone] for questions and concerns.

## 2018-05-13 NOTE — Progress Notes (Signed)
Events in the last 24 hours and noted.  Family has decided against tracheostomy with potential extubation in the near future.  This appears to be the best decision at this time given the long odds of recovery she is facing from her multiple issues including small cell lung cancer.  I have no further recommendation at this time I will continue to check on her periodically during this hospitalization as needed.  Transitioning to comfort care may be a distinct possibility if she fails extubation.

## 2018-05-13 NOTE — Progress Notes (Signed)
Goals of care  We stopped Precedex earlier this morning in hopes of having a goals of care discussion with the patient, her husband, and her daughter who are both present at bedside.  We talked about the current findings and the fact that she had 2 problems respiratory failure from pneumonia, COPD, further exacerbated by upper airway obstruction which may or may not be reversible.  We also talked about the finding of cancer.  She understands that the respiratory failure component is the primary concern at this point and understands that the only way to proceed given the fact she is failed 3 times would be to either place tracheostomy, or extubate with no plan for reintubation.  We talked at length about tracheostomy.  We talked about the probability of need for skilled nursing.  We talked about timeframe as far as likelihood about when she may be able to talk and eat.  Her family voiced significant concern about her already deconditioned state, and we all agreed that even in the best of scenario her recovery would be quite long with no guarantee that tracheostomy could be removed.  Quality of life was of the utmost importance to the family as well as the patient.  The biggest concern her husband voiced was what if the tracheostomy would be placed and we could not get it out.  The patient does not want to be in a nursing home.  They live in [place].  And she is already debilitated.  Also there is the cancer.  Her current functional status would prohibit her therapeutic options, she would need to make quite a bit of functional recovery before she could be candidate for physical therapy.  She was not sure she wanted to go through that.  Ultimately after a prolonged discussion including the patient, her husband, and her daughter all voicing/indicating their concerns we agreed that tracheostomy was not a route she wanted to go.  At this point the plan will be as follows:  #1 continue current supportive measures #2  full DO NOT RESUSCITATE #3 family will spend the next few days to get her affairs in order #4 one-way extubation in the next 24 to 48 hours.  We will hope for the best, however the patient understands that given her airway issues in the past we may need to quickly transition to comfort.  She indicates she is at comfort and peace with this decision  I have called the palliative care service Dr. Rowe Pavy and made her aware of our conversation.  The patient, her husband and daughter are all in agreement with this plan.  My time 35 minutes Erick Colace ACNP-BC [place] Pager # [phone] OR # [phone] if no answer

## 2018-05-13 NOTE — Progress Notes (Signed)
   05/13/18 1700  Clinical Encounter Type  Visited With Patient;Patient and family together  Visit Type Initial;Follow-up  Referral From Nurse  Spiritual Encounters  Spiritual Needs Emotional  Stress Factors  Patient Stress Factors Exhausted  Family Stress Factors Exhausted   Pt was intubated and alert at first when I arrived. Pt's husband on-site and wanted POA signed. Chaplain helped pt to complete it and soon Pt became seemingly very sick. Chaplain provided compassionate presence and completion of POA.  Dawn Howe a Medical sales representative, [employer]

## 2018-05-14 ENCOUNTER — Inpatient Hospital Stay (HOSPITAL_COMMUNITY): Payer: Medicare Other

## 2018-05-14 LAB — GLUCOSE, CAPILLARY
GLUCOSE-CAPILLARY: 124 mg/dL — AB (ref 70–99)
Glucose-Capillary: 110 mg/dL — ABNORMAL HIGH (ref 70–99)
Glucose-Capillary: 111 mg/dL — ABNORMAL HIGH (ref 70–99)
Glucose-Capillary: 116 mg/dL — ABNORMAL HIGH (ref 70–99)
Glucose-Capillary: 118 mg/dL — ABNORMAL HIGH (ref 70–99)
Glucose-Capillary: 130 mg/dL — ABNORMAL HIGH (ref 70–99)

## 2018-05-14 LAB — CBC WITH DIFFERENTIAL/PLATELET
ABS IMMATURE GRANULOCYTES: 0.1 10*3/uL (ref 0.0–0.1)
BASOS PCT: 0 %
Basophils Absolute: 0 10*3/uL (ref 0.0–0.1)
Eosinophils Absolute: 0.1 10*3/uL (ref 0.0–0.7)
Eosinophils Relative: 1 %
HCT: 29.4 % — ABNORMAL LOW (ref 36.0–46.0)
Hemoglobin: 9 g/dL — ABNORMAL LOW (ref 12.0–15.0)
IMMATURE GRANULOCYTES: 1 %
Lymphocytes Relative: 10 %
Lymphs Abs: 1 10*3/uL (ref 0.7–4.0)
MCH: 28.8 pg (ref 26.0–34.0)
MCHC: 30.6 g/dL (ref 30.0–36.0)
MCV: 94.2 fL (ref 78.0–100.0)
Monocytes Absolute: 0.6 10*3/uL (ref 0.1–1.0)
Monocytes Relative: 5 %
NEUTROS ABS: 8.5 10*3/uL — AB (ref 1.7–7.7)
NEUTROS PCT: 83 %
PLATELETS: 185 10*3/uL (ref 150–400)
RBC: 3.12 MIL/uL — AB (ref 3.87–5.11)
RDW: 13.7 % (ref 11.5–15.5)
WBC: 10.2 10*3/uL (ref 4.0–10.5)

## 2018-05-14 LAB — BASIC METABOLIC PANEL
Anion gap: 6 (ref 5–15)
BUN: 20 mg/dL (ref 8–23)
CALCIUM: 8.1 mg/dL — AB (ref 8.9–10.3)
CO2: 30 mmol/L (ref 22–32)
Chloride: 104 mmol/L (ref 98–111)
Creatinine, Ser: 0.54 mg/dL (ref 0.44–1.00)
GFR calc non Af Amer: >60 mL/min (ref >60)
GLUCOSE: 148 mg/dL — AB (ref 70–99)
Potassium: 3.5 mmol/L (ref 3.5–5.1)
Sodium: 140 mmol/L (ref 135–145)

## 2018-05-14 MED ORDER — CHLORHEXIDINE GLUCONATE CLOTH 2 % EX PADS
6.0000 | MEDICATED_PAD | Freq: Every day | CUTANEOUS | Status: DC
  Administered 2018-05-14: 6 via TOPICAL

## 2018-05-14 MED ORDER — ARFORMOTEROL TARTRATE 15 MCG/2ML IN NEBU
15.0000 ug | INHALATION_SOLUTION | Freq: Two times a day (BID) | RESPIRATORY_TRACT | Status: DC
  Administered 2018-05-14 – 2018-05-15 (×4): 15 ug via RESPIRATORY_TRACT
  Filled 2018-05-14 (×3): qty 2

## 2018-05-14 MED ORDER — SODIUM CHLORIDE 0.9% FLUSH: 10.0000 mL | INTRAVENOUS | Status: DC | PRN

## 2018-05-14 MED ORDER — POTASSIUM CHLORIDE 20 MEQ/15ML (10%) PO SOLN
20.0000 meq | ORAL | Status: AC
  Administered 2018-05-14 (×2): 20 meq
  Filled 2018-05-14 (×2): qty 15

## 2018-05-14 MED ORDER — SODIUM CHLORIDE 0.9% FLUSH
10.0000 mL | Freq: Two times a day (BID) | INTRAVENOUS | Status: DC
  Administered 2018-05-14 – 2018-05-15 (×4): 10 mL

## 2018-05-14 MED ORDER — BUDESONIDE 0.5 MG/2ML IN SUSP
0.5000 mg | Freq: Two times a day (BID) | RESPIRATORY_TRACT | Status: DC
  Administered 2018-05-14 – 2018-05-15 (×3): 0.5 mg via RESPIRATORY_TRACT
  Filled 2018-05-14 (×3): qty 2

## 2018-05-14 NOTE — Progress Notes (Signed)
[facility] ADULT ICU REPLACEMENT PROTOCOL FOR AM LAB REPLACEMENT ONLY  The patient does apply for the [facility] Adult ICU Electrolyte Replacment Protocol based on the criteria listed below:   1. Is GFR >/= 40 ml/min? Yes.    Patient's GFR today is >60 2. Is urine output >/= 0.5 ml/kg/hr for the last 6 hours? Yes.   Patient's UOP is .8 ml/kg/hr 3. Is BUN < 60 mg/dL? Yes.    Patient's BUN today is 20 4. Abnormal electrolyte(s):  K- 3.5 5. Ordered repletion with: per protocol 6. If a panic level lab has been reported, has the CCM MD in charge been notified? Yes.  .   Physician:  Dr. Harlene Ramus, [place] 05/14/2018 6:35 AM

## 2018-05-14 NOTE — Progress Notes (Signed)
72 year old smoker with COPD on 2 L and a prior history of lung cancer status post chemoradiation, cancer free for 6 years, admitted with Pseudomonas pneumonia and COPD exacerbation requiring mechanical ventilation. She was found to have hilar mass and bronchoscopy showed obstruction of the right upper lobe orifice and biopsy showed small cell lung cancer. She failed extubation x3.   On exam-calm, alert, following commands, husband at bedside, coarse breath sounds with bilateral scattered rhonchi, no accessory muscle use, S1-S2 normal, minimal secretions, soft and nontender abdomen, no edema or JVD.  Chest x-ray personally reviewed which shows ET tube in position and new right upper lobe perihilar infiltrates.  Labs show improved leukocytosis, normal electrolytes and stable hemoglobin/anemia.  Impression/plan  Acute on chronic respiratory failure with hypoxia and hypercarbia-continue spontaneous breathing trials.  Goals of care discussed in detail on 7/12 see preceding notes, she and family have opted against a tracheostomy, so would plan for one-way extubation when she is medically and emotionally ready.  COPD exacerbation-continue IV steroids, increase Pulmicort 2.5 twice daily and add Brovana twice daily continue duo nebs. Pseudomonas pneumonia/HCAP-has been treated with ceftazidime  New diagnosis of small cell lung cancer with obstruction of the right upper lobe orifice-doubt that she would gain functional status to get chemoradiation  Steroid-induced hyperglycemia-continue SSI resistant scale with Levemir. Intermittent agitation/delirium-continue Precedex.  My cc time  X 80m  Kara Mead MD. FCCP. [place] [facility] Pager [phone] If no response call [phone]   05/14/2018

## 2018-05-14 NOTE — Progress Notes (Signed)
Patient placed back on full support ventilation due to increased use of accessory muscles.  Patient tolerating well.  Will continue to monitor.

## 2018-05-14 NOTE — Progress Notes (Signed)
PULMONARY / CRITICAL CARE MEDICINE   Name: Dawn Howe MRN: [id number] DOB: Nov 10, 1945    ADMISSION DATE:  04/14/2018  REFERRING MD:  [facility].  CHIEF COMPLAINT:  Short of breath  HISTORY OF PRESENT ILLNESS:   72 yo female smoker travelling from [place] presented to [facility] hospital with dyspnea, and near syncope.  Required intubation for hypoxia/hypercapnia.  CT chest showed hilar mass with b/l pulmonary artery involvement and possible lymphangitic spread.  Echo showed systolic dysfunction.  Found to have Pseudomonal pneumonia and COPD exacerbation.  Has failed several attempts at extubation.  PAST MEDICAL HISTORY :  Lung cancer s/p chemoradiation, COPD on 2 liters, CAD, CHF, HTN.   SUBJECTIVE:  Currently weaning, precedex for sedation with prn fentanyl as she has some throat pain.She is awake and appropriate.    VITAL SIGNS: BP (!) 100/59   Pulse 96   Temp 98.8 F (37.1 C) (Oral)   Resp (!) 29   Ht 5\' 4"  (1.626 m)   Wt 167 lb 8.8 oz (76 kg)   SpO2 100%   BMI 28.76 kg/m   INTAKE / OUTPUT: I/O last 3 completed shifts: In: 1434.1 [I.V.:556.6; NG/GT:877.5] Out: 1985 [Urine:1985]  PHYSICAL EXAMINATION: General: Elderly female sedated and intubated, awake and following commands.  She is on Precedex, She is complaining of some throat pain. HEENT normocephalic atraumatic no JVD, orally intubated , MM Pink and moist Pulmonary: Weaning , bilateral chest excursion noted . Few rhonchi with  Scattered wheezing.  Cardiac: S1, S2, RRR, No RMG Extremities: Warm and dry, brisk capillary refill, no edema, no obvious deformities noted Neuro: Sedated on precedex, intermittently interactive. following commands and awake , MAE x 4  LABS:  BMET Recent Labs  Lab 05/12/18 0500 05/13/18 0500 05/14/18 0440  NA 140 139 140  K 3.9 3.9 3.5  CL 100 103 104  CO2 33* 30 30  BUN 22 17 20   CREATININE 0.58 0.64 0.54  GLUCOSE 155* 99 148*   Electrolytes Recent Labs  Lab  05/08/18 1644 05/09/18 0414 05/09/18 1835  05/12/18 0500 05/13/18 0500 05/14/18 0440  CALCIUM  --  8.5*  --    < > 8.3* 8.2* 8.1*  MG 2.2 2.4 2.3  --   --   --   --   PHOS 2.7 3.4 3.4  --   --   --   --    < > = values in this interval not displayed.   CBC Recent Labs  Lab 05/12/18 0500 05/13/18 0500 05/14/18 0440  WBC 16.9* 14.8* 10.2  HGB 10.3* 9.9* 9.0*  HCT 34.7* 33.5* 29.4*  PLT 180 192 185   ABG Recent Labs  Lab 05/07/18 2055 05/08/18 0540  PHART 7.583* 7.497*  PCO2ART 43.5 45.9  PO2ART 156.0* 91.4   Glucose Recent Labs  Lab 05/13/18 1150 05/13/18 1557 05/13/18 1938 05/14/18 0010 05/14/18 0410 05/14/18 0755  GLUCAP 103* 127* 113* 110* 124* 130*   Imaging Dg Chest Port 1 View  Result Date: 05/14/2018 CLINICAL DATA:  Acute respiratory failure. EXAM: PORTABLE CHEST 1 VIEW COMPARISON:  05/09/2018 FINDINGS: Endotracheal tube tip is above the carina. There is a left IJ catheter tip projecting over the SVC. Normal heart size. Persistent scar or atelectasis within the left perihilar region. Increase in right upper lobe perihilar densities IMPRESSION: 1. Increase in right upper lobe perihilar densities. Stable left mid lung perihilar atelectasis versus scar. Electronically Signed   By: Kerby Moors M.D.   On: 05/14/2018 08:06  STUDIES:  CT angio chest 6/27 [facility]) >> tumor encasement or b/l PA, b/l hilar masses, centrilobular emphysema, septal thickening, scattered nodules Echo 6/30 >> EF 45 to 50%, grade 1 DD, mod LA dilation, mild MR, PAS 36 mmHg  CULTURES: Urine 6/27 >> negative Sputum 6/27 >> Pseudomonas aeruginosa Blood 6/27 >> negative  ANTIBIOTICS: Zithromax 6/27 >> 6/28 Rocephin 6/27 >> 6/28 Fortaz 6/29 >> 7/05  SIGNIFICANT EVENTS: 6/27 transfer from [place], cardiology consulted 6/30 self extubated >> stridor >> reintubated, start decadron 7/02 extubated 7/03 wheeze, hypoxia, AMS >> reintubated 7/05 extubate 7/06  reintubated 7/08 bronchoscopy reveals external compression of subsegmental airway RUL. Biopsied.  LINES/TUBES: ETT 6/27 >> 6/30 (self extubation) Lt IJ CVL 6/27 >> Rt radial aline 6/27 >> 7/01 ETT 6/30 >> 7/02 ETT 7/03 >> 7/05 ETT 7/06 >>   DISCUSSION: 72 yo female smoker with acute on chronic hypoxic/hypercapnic respiratory failure from Pseudomonal pneumonia in setting of COPD with emphysema, lung cancer, systolic CHF.  Self extubated 6/30 with post extubation stridor and reintubated.  Extubated on 7/02.  Developed hypoxia/hypercapnia, wheezing, AMS 7/03 and reintubated.  Improved 7/05 >> will try extubation again.  Reintubated on 7/6.  Underwent bronchoscopy 7/8 biopsies obtained demonstrated small cell lung cancer.  Oncology is was consulted.  Family now waffling.  Initially thought we would be transitioning towards comfort, however they feel she would want to continue being aggressive per their interactions with her over the last 24 hours.  I explained to them the dots doubtful that her performance status would currently make her a candidate for chemotherapy however tracheostomy would not be unreasonable simply to liberate her from the ventilator.  Although I am not sure it is reasonable to assume that tracheostomy would be a guaranteed bridge to chemotherapy given her underlying performance status.  For now we will continue supportive care.  We will discontinue Precedex and changed to low-dose PRN fentanyl.  We will continue to discuss goals of care with patient.  If she is leaning towards ileostomy I doubt will build to get to this before Monday   ASSESSMENT / PLAN:  Acute on chronic respiratory failure with hypoxia, hypercapnia in setting of pseudomonas PNA and AECOPD (underlying severe emphysema)  Post-extubation stridor OSA  7/13 CXR shows Increase in right upper lobe perihilar densities. Stable left mid lung perihilar atelectasis versus scar. -Completed 7 days of ceftazidime -Failed  several extubation attempts due to post extubation stridor -With new diagnosis of recurrent small cell lung cancer family does not want to proceed with tracheostomy which will really leave [place] with no other choice other than one-way extubation Plan Cont current full vent support w/ daily pressure support ventilation PAD protocol RASS goal 0 Precedex for sedation with PRN fentanyl for breakthrough pain Plan for one way extubation 7/15 BD as ordered Maintain sats 88-92%   Hx of lung cancer now with diagnosis of small cell lung cancer obtained by bronchoscopy on 7/8 - d/w her oncologist in [place] >> cancer was from several years ago, and radiographic findings on CT chest 6/27 likely represent new findings - Awaiting records from [facility]. - Current mass not causing significant airway compromise. - Suspect airway collapse with agitation responsible. Plan Plan for one way extubation 7/15 , hoping for the best, but given her airway issues in the past we may need to quickly transition to comfort.  She indicates she is at comfort and peace with this decision. Husband/ family  is supportive of this decision.  ID Pseudomonas Pneumonia  Completed 7 days of ceftazidime Increase in RUL densities on 7/13 CXR T max  Last 24 hrs 100.9 Plan: Monitor fever, WBC Culture as indicated  Allergic rhinitis. Plan Cont Claritin, Cont Singulair, Cont flonase   Acute metabolic encephalopathy. Hx of depression, anxiety. Improved.  She is still sedated on precedex  infusion Plan Cont precedex Cont Buspar, remeron and paxil    Acute systolic and diastolic CHF. NSTEMI Type II. Hx of HLD. - cardiology s/o 7/06 Plan Cont lipitor Cont tele  Holding diuresis    Intermittent fluid and electrolyte imbalance. Plan Trend chemistries; intermittently  Will give 1 time dose of potassium today.  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 -Glycemic control acceptable Plan ssi and Levemir     DVT prophylaxis - lovenox SUP - protonix Nutrition - tube feeds Goals of care - Pt was made DNR 7/12 after discussion of goals of care with Marni Griffon NP.Pt. And family do not want trach in light of recent re-current cancer diagnosis. Plan for one way extubation 7/15, hoping for the best, but knowing her history with airway issues, we will transition to comfort care quickly if she starts to decompensate. Pt. Appears to be at peace with this decision , and her husband and family support this plan.      Magdalen Spatz, AGACNP-BC [place] Pager # [phone] OR # [phone] if no answer

## 2018-05-14 NOTE — Progress Notes (Signed)
Patient placed on CPAP/PSV of 15/5 at 0800.  Currently tolerating well.  Will continue to monitor.

## 2018-05-15 ENCOUNTER — Inpatient Hospital Stay (HOSPITAL_COMMUNITY): Payer: Medicare Other

## 2018-05-15 DIAGNOSIS — J441 Chronic obstructive pulmonary disease with (acute) exacerbation: Secondary | ICD-10-CM

## 2018-05-15 LAB — CBC WITH DIFFERENTIAL/PLATELET
Abs Immature Granulocytes: 0 10*3/uL (ref 0.0–0.1)
Basophils Absolute: 0 10*3/uL (ref 0.0–0.1)
Basophils Relative: 0 %
EOS ABS: 0.1 10*3/uL (ref 0.0–0.7)
EOS PCT: 1 %
HEMATOCRIT: 28.8 % — AB (ref 36.0–46.0)
Hemoglobin: 8.7 g/dL — ABNORMAL LOW (ref 12.0–15.0)
Immature Granulocytes: 0 %
LYMPHS ABS: 1 10*3/uL (ref 0.7–4.0)
Lymphocytes Relative: 11 %
MCH: 28.5 pg (ref 26.0–34.0)
MCHC: 30.2 g/dL (ref 30.0–36.0)
MCV: 94.4 fL (ref 78.0–100.0)
MONOS PCT: 7 %
Monocytes Absolute: 0.6 10*3/uL (ref 0.1–1.0)
Neutro Abs: 7.8 10*3/uL — ABNORMAL HIGH (ref 1.7–7.7)
Neutrophils Relative %: 81 %
PLATELETS: 174 10*3/uL (ref 150–400)
RBC: 3.05 MIL/uL — AB (ref 3.87–5.11)
RDW: 13.8 % (ref 11.5–15.5)
WBC: 9.6 10*3/uL (ref 4.0–10.5)

## 2018-05-15 LAB — BASIC METABOLIC PANEL
Anion gap: 5 (ref 5–15)
BUN: 19 mg/dL (ref 8–23)
CALCIUM: 8.3 mg/dL — AB (ref 8.9–10.3)
CO2: 30 mmol/L (ref 22–32)
CREATININE: 0.54 mg/dL (ref 0.44–1.00)
Chloride: 107 mmol/L (ref 98–111)
GFR calc Af Amer: >60 mL/min (ref >60)
GFR calc non Af Amer: >60 mL/min (ref >60)
Glucose, Bld: 139 mg/dL — ABNORMAL HIGH (ref 70–99)
Potassium: 3.9 mmol/L (ref 3.5–5.1)
Sodium: 142 mmol/L (ref 135–145)

## 2018-05-15 LAB — GLUCOSE, CAPILLARY
GLUCOSE-CAPILLARY: 120 mg/dL — AB (ref 70–99)
GLUCOSE-CAPILLARY: 162 mg/dL — AB (ref 70–99)
GLUCOSE-CAPILLARY: 92 mg/dL (ref 70–99)
Glucose-Capillary: 153 mg/dL — ABNORMAL HIGH (ref 70–99)
Glucose-Capillary: 109 mg/dL — ABNORMAL HIGH (ref 70–99)
Glucose-Capillary: 89 mg/dL (ref 70–99)
Glucose-Capillary: 125 mg/dL — ABNORMAL HIGH (ref 70–99)

## 2018-05-15 MED ORDER — SODIUM CHLORIDE 0.9 % IV BOLUS
500.0000 mL | Freq: Once | INTRAVENOUS | Status: AC
  Administered 2018-05-16: 500 mL via INTRAVENOUS

## 2018-05-15 MED ORDER — FUROSEMIDE 10 MG/ML IJ SOLN
40.0000 mg | Freq: Two times a day (BID) | INTRAMUSCULAR | Status: DC
  Filled 2018-05-15: qty 4

## 2018-05-15 MED ORDER — POTASSIUM CHLORIDE 20 MEQ/15ML (10%) PO SOLN
40.0000 meq | Freq: Once | ORAL | Status: AC
  Administered 2018-05-15: 40 meq via ORAL
  Filled 2018-05-15: qty 30

## 2018-05-15 MED ORDER — FUROSEMIDE 10 MG/ML IJ SOLN: 20.0000 mg | Freq: Two times a day (BID) | INTRAMUSCULAR | Status: DC

## 2018-05-15 MED ORDER — FENTANYL CITRATE (PF) 100 MCG/2ML IJ SOLN
50.0000 ug | INTRAMUSCULAR | Status: DC | PRN
  Administered 2018-05-15 – 2018-05-16 (×3): 100 ug via INTRAVENOUS
  Filled 2018-05-15 (×3): qty 2

## 2018-05-15 MED ORDER — FENTANYL CITRATE (PF) 100 MCG/2ML IJ SOLN
100.0000 ug | Freq: Once | INTRAMUSCULAR | Status: AC
  Administered 2018-05-15: 100 ug via INTRAVENOUS

## 2018-05-15 NOTE — Progress Notes (Signed)
72 year old smoker with COPD on 2 L and a prior history of lung cancer status post chemoradiation  In 2013  admitted with Pseudomonas pneumonia and COPD exacerbation requiring mechanical ventilation. She was found to have hilar mass and bronchoscopy showed obstruction of the right upper lobe orifice and biopsy showed small cell lung cancer. She failed extubation x3 , mainly due to upper airway issues.  Interval history-she weaned to 4 2 hours on pressure support 15/5 yesterday.  Developed respiratory distress after a bath overnight Has required more sedation with Precedex last 2 days.  Afebrile, critically ill, remains orally intubated but awake on Precedex and can communicate by writing, nonfocal, 2+ edema all extremities, no JVD, decreased breath sounds bilateral, soft distended abdomen, S1-S2 normal.  Labs show normal electrolytes , mild anemia and thrombocytopenia.  Chest x-ray shows increased bilateral infiltrates in the pattern of edema. Impression/plan  Acute on chronic respiratory failure with hypoxia and hypercarbia-spontaneous breathing trials have been limited in the last 24 to 48 hours by some agitation and edema. Patient goals of care discussion, plan is for one-way extubation on Monday.  Family seems to be emotionally ready for this.  Only question would be whether she is needs to be premedicated with morphine or not, would depend on whether she has a successful spontaneous breathing trial  Pseudomonas pneumonia/HCAP-treated with ceftazidime.  COPD exacerbation-continue IV steroids, Pulmicort and Brovana twice daily with duo nebs as needed.  Small cell lung cancer with obstruction of right upper lobe orifice-new diagnosis, she would need to regain functional status after successful extubation to get chemoradiation, seems unlikely  Continue Precedex and low-dose clonazepam for intermittent agitation. Use Lasix 20 every 12 for fluid overload   My critical care time x 57m,  updated family at bedside  Yu Peggs V. Elsworth Soho MD

## 2018-05-15 NOTE — Progress Notes (Signed)
PCCM Interval Note   Called to bedside for hypotension. BP currently 73/51. CVP 6. 10 pm dose Lasix held. Will give 500cc bolus now.   Hayden Pedro, AGACNP-BC [facility]  Pgr: [phone]  PCCM Pgr: [phone]

## 2018-05-15 NOTE — Progress Notes (Signed)
Daily Progress Note   Patient Name: Dawn Howe       Date: 05/15/2018 DOB: 1946/04/06  Age: 72 y.o. MRN#: [id number] Attending Physician: Kipp Brood, MD Primary Care Physician: Patient, No Pcp Per Admit Date: 04/29/2018  Reason for Consultation/Follow-up: Establishing goals of care  Subjective:  patient remains intubated, opens eyes briefly, husband is at bedside, see below  Length of Stay: 17  Current Medications: Scheduled Meds:  . arformoterol  15 mcg Nebulization BID  . atorvastatin  20 mg Per Tube q1800  . budesonide (PULMICORT) nebulizer solution  0.5 mg Nebulization BID  . busPIRone  15 mg Per Tube Daily  . chlorhexidine gluconate (MEDLINE KIT)  15 mL Mouth Rinse BID  . Chlorhexidine Gluconate Cloth  6 each Topical Daily  . clonazepam  0.5 mg Per Tube BID  . enoxaparin (LOVENOX) injection  40 mg Subcutaneous Q24H  . etomidate  40 mg Intravenous Once  . fentaNYL (SUBLIMAZE) injection  200 mcg Intravenous Once  . fluticasone  1 spray Each Nare Daily  . furosemide  40 mg Intravenous Q12H  . insulin aspart  0-20 Units Subcutaneous Q4H  . insulin detemir  10 Units Subcutaneous Daily  . ipratropium-albuterol  3 mL Nebulization Q6H  . loratadine  10 mg Per Tube Daily  . mouth rinse  15 mL Mouth Rinse 10 times per day  . midazolam  4 mg Intravenous Once  . mirtazapine  30 mg Per Tube QHS  . montelukast  10 mg Per Tube QHS  . pantoprazole sodium  40 mg Per Tube Q24H  . PARoxetine  40 mg Per Tube Daily  . sodium chloride  1 spray Each Nare BID  . sodium chloride flush  10-40 mL Intracatheter Q12H    Continuous Infusions: . sodium chloride 10 mL/hr at 05/15/18 1300  . dexmedetomidine (PRECEDEX) IV infusion 0.6 mcg/kg/hr (05/15/18 1300)  . feeding supplement (VITAL  HIGH PROTEIN) 1,000 mL (05/14/18 1205)    PRN Meds: sodium chloride, acetaminophen, albuterol, fentaNYL (SUBLIMAZE) injection, ibuprofen, LORazepam, polyethylene glycol, senna-docusate, sodium chloride flush  Physical Exam         Opens eyes, awake Endotracheal tube Orogastric tube S1-S2 Abdomen is not distended  patient does not have edema Anterior breath sounds clear bilaterally  Vital Signs: BP (!) 84/58   Pulse 81  Temp 98.5 F (36.9 C) (Oral)   Resp 19   Ht _0  (1.626 m)   Wt 76.3 kg (168 lb 3.4 oz)   SpO2 97%   BMI 28.87 kg/m  SpO2: SpO2: 97 % O2 Device: O2 Device: Ventilator O2 Flow Rate: O2 Flow Rate (L/min): 3 L/min  Intake/output summary:   Intake/Output Summary (Last 24 hours) at 05/15/2018 1403 Last data filed at 05/15/2018 1300 Gross per 24 hour  Intake 1263.04 ml  Output 885 ml  Net 378.04 ml   LBM: Last BM Date: 05/15/18 Baseline Weight: Weight: 85.2 kg (187 lb 13.3 oz) Most recent weight: Weight: 76.3 kg (168 lb 3.4 oz) Palliative performance scale 20%      Palliative Assessment/Data:      Patient Active Problem List   Diagnosis Date Noted  . SCLC (small cell lung carcinoma) ([place])   . Ventilator dependence ([place])   . Goals of care, counseling/discussion   . Palliative care encounter   . Hilar mass   . Demand ischemia ([place])   . Acute pulmonary edema ([facility])   . Hypokalemia   . SEMI (subendocardial myocardial infarction) ([place])   . Acute systolic congestive heart failure ([place])   . Acute respiratory failure with hypoxia [facility])     Palliative Care Assessment & Plan   Patient Profile:    Assessment:  72 year old lady with COPD, prior history of lung cancer, admitted now with ventilator dependent respiratory failure, Pseudomonas pneumonia, COPD exacerbation. Patient remains intubated and mechanically ventilated. Hospital course is significant for the fact that the patient has not been able to be extubated from the ventilator 3.  Additionally, imaging has shown hilar mass obstruction of the right upper lobe biopsy showing small cell lung cancer.   Palliative medicine consultation has been requested for further goals of care discussions. Initial consultation was completed on 05-12-18.    Recommendations/Plan:   family meeting: I met with the patient, who is intubated and ventilated. Husband is also present at the bedside.  We reviewed patient's current medical condition, possible plan of one-way extubation on Monday 30-May-2018. I did discuss with husband about one-way extubation, judicious use of opioids and benzodiazepines on an as-needed basis during and after extubation with overall goals being to keep the patient as comfortable as possible and engaging with hospice services later on if appropriate.  I offered supportive care, active listening and engaged with the patient's husband. At times, he avoids eye contact. He states,"it is what it is." Offered him support and PMT will continue to follow going forward.    Code Status:    Code Status Orders  (From admission, onward)        Start     Ordered   7-14/19     Now DO NOT RESUSCITATE   04/02/2018 1607    Code Status History    This patient has a current code status but no historical code status.       Prognosis:   guarded   Discharge Planning:  To Be Determined  Care plan was discussed with  husband.  Patient did not participate much, she is on the vent.    Thank you for allowing the Palliative Medicine Team to assist in the care of this patient.   Time In: 12.30 Time Out: 12.55 Total Time 25 Prolonged Time Billed  no       Greater than 50%  of this time was spent counseling and coordinating care related to the above assessment and plan.  Loistine Chance, MD [phone]  Please contact Palliative Medicine Team phone at [phone] for questions and concerns.

## 2018-05-15 NOTE — Progress Notes (Signed)
Upon arrival to patient room this AM noted that patient's ventilator alarm was ringing regulation pressure limited and peak pressures were reading 40s.  Attempted to suction patient however was only able to pass catheter halfway down ETT.  Lavaged patient and was able to obtain a moderate amount of thick yellow secretions/plugs.  Patient accessory muscle use improved however still noticeable.  Will hold on daily SBT.  Currently receiving AM nebulizer treatments.

## 2018-05-15 NOTE — Progress Notes (Signed)
[place] Progress Note Patient Name: Dawn Howe DOB: 23-Jun-1946 MRN: [id number]   Date of Service  05/15/2018  HPI/Events of Note  Agitation   eICU Interventions  Will order: 1. Increase Fentanyl dose to 50-100 mcg IV Q 2 hours PRN agitation.     Intervention Category Minor Interventions: Agitation / anxiety - evaluation and management  Sommer,Steven Eugene 05/15/2018, 7:25 PM

## 2018-05-15 NOTE — Progress Notes (Signed)
Pt spouse & daughter asked the spelling of the patients name in the chart due to staff calling patient by the wrong name, stating pt name is "[name]" rather than what is listed in the chart as "Dawn Howe". Pt spouse, Harrie Jeans, requested to have this changed immediately. This RN called admitting office at [phone] and left a voicemail.

## 2018-05-15 NOTE — Progress Notes (Addendum)
PULMONARY / CRITICAL CARE MEDICINE   Name: Dawn Howe MRN: [id number] DOB: 1946/09/04    ADMISSION DATE:  04/17/2018  REFERRING MD:  [facility].  CHIEF COMPLAINT:  Short of breath  HISTORY OF PRESENT ILLNESS:   72 yo female smoker travelling from [place] presented to [facility] hospital with dyspnea, and near syncope.  Required intubation for hypoxia/hypercapnia.  CT chest showed hilar mass with b/l pulmonary artery involvement and possible lymphangitic spread.  Echo showed systolic dysfunction.  Found to have Pseudomonal pneumonia and COPD exacerbation.  Has failed several attempts at extubation.  PAST MEDICAL HISTORY :  Lung cancer s/p chemoradiation, COPD on Home O2 at  2 liters, CAD, CHF, HTN.   SUBJECTIVE:  Full vent support 7/14/am , precedex increased overnight for comfort as she has some throat pain. She is more sedated this am, but arouses with stimulation. T Max 99.1 + 677 last 24 hours  VITAL SIGNS: BP (!) 89/59 (BP Location: Right Arm)   Pulse 99   Temp 99.3 F (37.4 C) (Oral)   Resp (!) 22   Ht 5\' 4"  (1.626 m)   Wt 168 lb 3.4 oz (76.3 kg)   SpO2 94%   BMI 28.87 kg/m   INTAKE / OUTPUT: I/O last 3 completed shifts: In: 2761.1 [I.V.:621.1; NG/GT:2140] Out: 1782 [Urine:1782]  PHYSICAL EXAMINATION: General: Elderly female sedated and intubated, resting , follows commands when aroused  She remains on precedex HEENT normocephalic atraumatic no JVD, orally intubated , OG tube , MM Pink and moist Pulmonary:  bilateral chest excursion noted . Few rhonchi with +  Scattered wheezing.  Cardiac: S1, S2, RRR, No RMG Extremities: Warm and dry, brisk capillary refill, no edema, no obvious deformities noted Neuro: Sedated on precedex, intermittently interactive. following commands and awake , MAE x 4  LABS:  BMET Recent Labs  Lab 05/13/18 0500 05/14/18 0440 05/15/18 0419  NA 139 140 142  K 3.9 3.5 3.9  CL 103 104 107  CO2 30 30 30   BUN 17 20 19   CREATININE  0.64 0.54 0.54  GLUCOSE 99 148* 139*   Electrolytes Recent Labs  Lab 05/08/18 1644 05/09/18 0414 05/09/18 1835  05/13/18 0500 05/14/18 0440 05/15/18 0419  CALCIUM  --  8.5*  --    < > 8.2* 8.1* 8.3*  MG 2.2 2.4 2.3  --   --   --   --   PHOS 2.7 3.4 3.4  --   --   --   --    < > = values in this interval not displayed.   CBC Recent Labs  Lab 05/13/18 0500 05/14/18 0440 05/15/18 0419  WBC 14.8* 10.2 9.6  HGB 9.9* 9.0* 8.7*  HCT 33.5* 29.4* 28.8*  PLT 192 185 174   ABG No results for input(s): PHART, PCO2ART, PO2ART in the last 168 hours. Glucose Recent Labs  Lab 05/14/18 1150 05/14/18 1616 05/14/18 2011 05/15/18 0007 05/15/18 0358 05/15/18 0757  GLUCAP 116* 118* 111* 120* 153* 109*   Imaging Dg Chest Port 1 View  Result Date: 05/15/2018 CLINICAL DATA:  Respiratory failure. EXAM: PORTABLE CHEST 1 VIEW COMPARISON:  05/14/2018 FINDINGS: Endotracheal tube tip is above the carina. There is an enteric tube with tip below the GE junction. Aortic atherosclerosis and mild cardiac enlargement noted. Mild diffuse pulmonary edema pattern is again noted and appears similar to previous exam. IMPRESSION: No change in mild diffuse pulmonary edema Electronically Signed   By: Kerby Moors M.D.   On: 05/15/2018 08:04  STUDIES:  CT angio chest 6/27 [facility]) >> tumor encasement or b/l PA, b/l hilar masses, centrilobular emphysema, septal thickening, scattered nodules Echo 6/30 >> EF 45 to 50%, grade 1 DD, mod LA dilation, mild MR, PAS 36 mmHg  CULTURES: Urine 6/27 >> negative Sputum 6/27 >> Pseudomonas aeruginosa Blood 6/27 >> negative  ANTIBIOTICS: Zithromax 6/27 >> 6/28 Rocephin 6/27 >> 6/28 Fortaz 6/29 >> 7/05  SIGNIFICANT EVENTS: 6/27 transfer from [place], cardiology consulted 6/30 self extubated >> stridor >> reintubated, start decadron 7/02 extubated 7/03 wheeze, hypoxia, AMS >> reintubated 7/05 extubate 7/06 reintubated 7/08 bronchoscopy reveals  external compression of subsegmental airway RUL. Biopsied.  LINES/TUBES: ETT 6/27 >> 6/30 (self extubation) Lt IJ CVL 6/27 >> Rt radial aline 6/27 >> 7/01 ETT 6/30 >> 7/02 ETT 7/03 >> 7/05 ETT 7/06 >>   DISCUSSION: 72 yo female smoker with acute on chronic hypoxic/hypercapnic respiratory failure from Pseudomonal pneumonia in setting of COPD with emphysema, lung cancer, systolic CHF.  Self extubated 6/30 with post extubation stridor and reintubated.  Extubated on 7/02.  Developed hypoxia/hypercapnia, wheezing, AMS 7/03 and reintubated.  Improved 7/05 >> will try extubation again.  Reintubated on 7/6.  Underwent bronchoscopy 7/8 biopsies obtained demonstrated small cell lung cancer.  Oncology is was consulted.  Family now waffling.  Initially thought we would be transitioning towards comfort, however they feel she would want to continue being aggressive per their interactions with her over the last 24 hours.  I explained to them the dots doubtful that her performance status would currently make her a candidate for chemotherapy however tracheostomy would not be unreasonable simply to liberate her from the ventilator.  Although I am not sure it is reasonable to assume that tracheostomy would be a guaranteed bridge to chemotherapy given her underlying performance status.  For now we will continue supportive care.  We will discontinue Precedex and changed to low-dose PRN fentanyl.  We will continue to discuss goals of care with patient.  If she is leaning towards ileostomy I doubt will build to get to this before Monday   ASSESSMENT / PLAN:  Acute on chronic respiratory failure with hypoxia, hypercapnia in setting of pseudomonas PNA and AECOPD (underlying severe emphysema)  Post-extubation stridor OSA  7/13 CXR shows mild diffuse pulmonary edema -Completed 7 days of ceftazidime -Failed several extubation attempts due to post extubation stridor -With new diagnosis of recurrent small cell lung cancer  family does not want to proceed with tracheostomy which will really leave [place] with no other choice other than one-way extubation Plan Cont current full vent support w/ daily pressure support ventilation as able PAD protocol RASS goal 0 Precedex for sedation with PRN fentanyl for breakthrough pain Plan for one way extubation 7/15 or when patient and family are emotionally ready BD frequency increased 7/13 as continued wheezing Titrate oxygen Maintain sats 88-92% Lasix 20 mg BID 7/14 if BP tolerates to optimize pulmonary status prior to one way extubation 7/15   Hx of lung cancer now with diagnosis of small cell lung cancer obtained by bronchoscopy on 7/8 - d/w her oncologist in [place] >> cancer was from several years ago, and radiographic findings on CT chest 6/27 likely represent new findings - Awaiting records from [facility] - Current mass not causing significant airway compromise. - Suspect airway collapse with agitation responsible.  Plan Plan for one way extubation 7/15 or when ready , hoping for the best, but given her airway issues in the past we may need to quickly  transition to comfort.  She indicates she is at comfort and peace with this decision. Husband/ family  is supportive of this decision.  ID Pseudomonas Pneumonia Completed 7 days of ceftazidime Increase in RUL densities on 7/13 CXR T max  Last 24 hrs 99.1 Plan: Monitor fever, WBC Culture as indicated  Allergic rhinitis. Plan Cont Claritin, Cont Singulair, Cont flonase   Acute metabolic encephalopathy. Hx of depression, anxiety. Improved.  She is still sedated on precedex  infusion Plan Cont precedex Cont Buspar, remeron and paxil  Monitor neuro status   Acute systolic and diastolic CHF. NSTEMI Type II. Hx of HLD. - cardiology s/o 7/06 Plan Cont lipitor Cont tele  Lasix 20 mg BID 7/15  Intermittent fluid and electrolyte imbalance.  Plan Trend chemistries; intermittently  Will give 1 time dose of  potassium today. 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 -Glycemic control acceptable  Plan ssi and Levemir     DVT prophylaxis - lovenox SUP - protonix Nutrition - tube feeds Goals of care - Pt was made DNR 7/12 after discussion of goals of care with Marni Griffon NP.Pt. And family do not want trach in light of recent re-current cancer diagnosis. Plan for one way extubation 7/15, hoping for the best, but knowing her history with airway issues, we will transition to comfort care quickly if she starts to decompensate. Pt. Appears to be at peace with this decision , and her husband and family support this plan.  Family at bedside 05/15/2018, updated by Dr. Elsworth Soho and myself on rounds      Magdalen Spatz, AGACNP-BC [place] Pager # [phone] OR # [phone] if no answer 05/15/2018 8:59 AM

## 2018-05-15 NOTE — Progress Notes (Signed)
[place] Progress Note Patient Name: Vernee Baines DOB: 09-26-1946 MRN: [id number]   Date of Service  05/15/2018  HPI/Events of Note  Severe agitation - patient is agitated post AM bath. Request for Fentanyl IV push.   eICU Interventions  Will order: 1. Fentanyl 100 mcg IV X 1 now.      Intervention Category Major Interventions: Delirium, psychosis, severe agitation - evaluation and management  Cleon Thoma Eugene 05/15/2018, 6:43 AM

## 2018-05-16 LAB — CBC
HCT: 27.9 % — ABNORMAL LOW (ref 36.0–46.0)
Hemoglobin: 8.3 g/dL — ABNORMAL LOW (ref 12.0–15.0)
MCH: 28.3 pg (ref 26.0–34.0)
MCHC: 29.7 g/dL — ABNORMAL LOW (ref 30.0–36.0)
MCV: 95.2 fL (ref 78.0–100.0)
PLATELETS: 171 10*3/uL (ref 150–400)
RBC: 2.93 MIL/uL — AB (ref 3.87–5.11)
RDW: 13.8 % (ref 11.5–15.5)
WBC: 10.7 10*3/uL — ABNORMAL HIGH (ref 4.0–10.5)

## 2018-05-16 LAB — BASIC METABOLIC PANEL
Anion gap: 4 — ABNORMAL LOW (ref 5–15)
BUN: 20 mg/dL (ref 8–23)
CHLORIDE: 109 mmol/L (ref 98–111)
CO2: 29 mmol/L (ref 22–32)
CREATININE: 0.57 mg/dL (ref 0.44–1.00)
Calcium: 8.2 mg/dL — ABNORMAL LOW (ref 8.9–10.3)
GFR calc Af Amer: >60 mL/min (ref >60)
GFR calc non Af Amer: >60 mL/min (ref >60)
Glucose, Bld: 126 mg/dL — ABNORMAL HIGH (ref 70–99)
Potassium: 4.1 mmol/L (ref 3.5–5.1)
Sodium: 142 mmol/L (ref 135–145)

## 2018-05-16 LAB — CORTISOL: Cortisol, Plasma: 8.8 ug/dL

## 2018-05-16 LAB — MAGNESIUM: Magnesium: 2.1 mg/dL (ref 1.7–2.4)

## 2018-05-16 MED ORDER — GLYCOPYRROLATE 1 MG PO TABS
1.0000 mg | ORAL_TABLET | ORAL | Status: DC | PRN
  Filled 2018-05-16: qty 1

## 2018-05-16 MED ORDER — HYDROCORTISONE NA SUCCINATE PF 100 MG IJ SOLR
50.0000 mg | Freq: Four times a day (QID) | INTRAMUSCULAR | Status: DC
  Administered 2018-05-16: 50 mg via INTRAVENOUS
  Filled 2018-05-16: qty 2

## 2018-05-16 MED ORDER — LORAZEPAM 2 MG/ML IJ SOLN: 1.0000 mg | INTRAMUSCULAR | Status: DC | PRN

## 2018-05-16 MED ORDER — MORPHINE BOLUS VIA INFUSION
5.0000 mg | INTRAVENOUS | Status: DC | PRN
  Administered 2018-05-16: 12.5 mg via INTRAVENOUS
  Administered 2018-05-16: 10 mg via INTRAVENOUS
  Administered 2018-05-16: 5 mg via INTRAVENOUS
  Filled 2018-05-16: qty 20

## 2018-05-16 MED ORDER — GLYCOPYRROLATE 0.2 MG/ML IJ SOLN: 0.2000 mg | INTRAMUSCULAR | Status: DC | PRN

## 2018-05-16 MED ORDER — IPRATROPIUM-ALBUTEROL 0.5-2.5 (3) MG/3ML IN SOLN: 3.0000 mL | Freq: Four times a day (QID) | RESPIRATORY_TRACT | Status: DC | PRN

## 2018-05-16 MED ORDER — MORPHINE 100MG IN NS 100ML (1MG/ML) PREMIX INFUSION
10.0000 mg/h | INTRAVENOUS | Status: DC
  Administered 2018-05-16: 10 mg/h via INTRAVENOUS
  Administered 2018-05-16: 20 mg/h via INTRAVENOUS
  Filled 2018-05-16 (×2): qty 100

## 2018-05-16 MED ORDER — HYDROMORPHONE HCL 1 MG/ML IJ SOLN: 0.5000 mg | INTRAMUSCULAR | Status: DC | PRN

## 2018-05-16 MED ORDER — IBUPROFEN 100 MG/5ML PO SUSP: 200.0000 mg | Freq: Four times a day (QID) | ORAL | Status: DC | PRN

## 2018-05-16 MED ORDER — SODIUM CHLORIDE 0.9 % IV SOLN: INTRAVENOUS | Status: DC

## 2018-05-16 MED ORDER — GLYCOPYRROLATE 0.2 MG/ML IJ SOLN
0.2000 mg | INTRAMUSCULAR | Status: DC | PRN
  Administered 2018-05-16: 0.2 mg via INTRAVENOUS
  Filled 2018-05-16: qty 1

## 2018-05-17 ENCOUNTER — Telehealth: Payer: Self-pay | Admitting: *Deleted

## 2018-05-17 NOTE — Telephone Encounter (Signed)
Death Certificate received from [facility], D/C will be taken to Dr. Lynetta Mare at [place] [facility] to be signed.

## 2018-05-23 ENCOUNTER — Telehealth: Payer: Self-pay

## 2018-05-23 NOTE — Telephone Encounter (Signed)
On 05/23/18 I received the d/c back from Doctor Agarwala.  I got the d/c ready and called the funeral home to let them know the d/c is ready for pickup.

## 2018-05-25 DIAGNOSIS — R6521 Severe sepsis with septic shock: Secondary | ICD-10-CM

## 2018-05-25 DIAGNOSIS — I10 Essential (primary) hypertension: Secondary | ICD-10-CM | POA: Diagnosis present

## 2018-05-25 DIAGNOSIS — J151 Pneumonia due to Pseudomonas: Secondary | ICD-10-CM | POA: Diagnosis present

## 2018-05-25 DIAGNOSIS — D649 Anemia, unspecified: Secondary | ICD-10-CM | POA: Diagnosis not present

## 2018-05-25 DIAGNOSIS — C3490 Malignant neoplasm of unspecified part of unspecified bronchus or lung: Secondary | ICD-10-CM | POA: Diagnosis not present

## 2018-05-25 DIAGNOSIS — A419 Sepsis, unspecified organism: Secondary | ICD-10-CM | POA: Diagnosis present

## 2018-05-25 DIAGNOSIS — J441 Chronic obstructive pulmonary disease with (acute) exacerbation: Secondary | ICD-10-CM | POA: Diagnosis present

## 2018-05-25 DIAGNOSIS — R739 Hyperglycemia, unspecified: Secondary | ICD-10-CM | POA: Diagnosis not present

## 2018-05-25 DIAGNOSIS — R061 Stridor: Secondary | ICD-10-CM | POA: Diagnosis not present

## 2018-06-02 NOTE — Progress Notes (Signed)
Palliative:  Discussed with RN and Jerene Pitch, NP PCCM. During our discussion Dawn Howe's heart rate and oxygen level began to drop. I went to bedside to inform daughter that appears time is nearing and could be any minute. RN went to find husband but unsuccessful. Daughter called her father to bedside. Emotional support provided. Left family to hold vigil with RN at bedside for support.   No charge  Vinie Sill, NP Palliative Medicine Team Pager # [phone] (M-F 8a-5p) Team Phone # [phone] (Nights/Weekends)

## 2018-06-02 NOTE — Death Summary Note (Signed)
DEATH SUMMARY   Patient Details  Name: Dawn Howe MRN: [id number] DOB: May 15, 1946  Admission/Discharge Information   Admit Date:  05-26-2018  Date of Death: Date of Death: 06-13-18  Time of Death: Time of Death: 39  Length of Stay: 02/15/23  Referring Physician: Patient, No Pcp Per   Reason(s) for Hospitalization  Acute hypoxic and hypercapnic respiratory failure  Diagnoses  Preliminary cause of death:   Acute hypoxic and hypercapnic respiratory failure  Secondary Diagnoses (including complications and co-morbidities):  Principal Problem:   Acute respiratory failure with hypoxia ([place]) Active Problems:   SEMI (subendocardial myocardial infarction) ([place])   Acute on chronic combined systolic and diastolic congestive heart failure (HCC)   Hypokalemia   Hilar mass   SCLC (small cell lung carcinoma) (HCC)   Ventilator dependence ([place])   Non-small cell lung cancer ([place])   Pseudomonal pneumonia ([place])   Acute exacerbation of chronic obstructive pulmonary disease (COPD) ([place])   Septic shock ([place])   Stridor   Hypertension   Hyperglycemia   Anemia   Brief Hospital Course (including significant findings, care, treatment, and services provided and events leading to death)  Darsha Zumstein is a 72 y.o. year old female with a history of COPD, hypertension, chronic systolic CHF and previously treated non-small cell lung cancer who was traveling from [place] who presented initially to [facility] 05-26-2018 with acute dyspnea and near syncope.  She progressed to combined hypoxic and hypercapnic respiratory failure and required intubation and mechanical ventilation.  CT scan of the chest revealed a right hilar mass with impact on the right pulmonary artery and mixed density of infiltrate concerning for pneumonia and possible lymphangitic spread of cancer.  She was treated empirically with abx for pneumonia and cultures ultimately grew out Pseudomonas.  She developed hypotension  consistent with septic shock and was supported with IV fluids and pressors.  COPD exacerbation was treated with steroids and bronchodilators. In the setting of her respiratory failure and infection she had a positive troponin consistent with a stress related non-ST elevation MI.  Echocardiogram 6/30 confirmed decreased LV function, grade 1 diastolic dysfunction. She was extubated three times but failed on each occasion due to stridor, wheeze and hypoxia. Based on her R hilar mass and associated RUL airspace disease, bronchoscopy was performed on 05/09/2018, revealing partial obstruction of the anterior segment of the right upper lobe airway.  This was biopsied and unfortunately revealed small cell lung cancer. It was felt that her failed extubations were a result of cumulative impact of her PNA, obstructive lung disease component of acute systolic and diastolic CHF and the underlying burden of small cell lung CA.  Conversations were initiated with the patient's family about poor prognosis for extubation and long term survival. Plans were in place for a transition to comfort care. This was accelerated on 7/14 when she became less hemodynamically stable. She was started on morphine for comfort and family was called to bedside. She was extubated for comfort on am 2023/06/14 and expired same day.     Pertinent Labs and Studies  Significant Diagnostic Studies Dg Chest Port 1 View  Result Date: 05/15/2018 CLINICAL DATA:  Respiratory failure. EXAM: PORTABLE CHEST 1 VIEW COMPARISON:  05/14/2018 FINDINGS: Endotracheal tube tip is above the carina. There is an enteric tube with tip below the GE junction. Aortic atherosclerosis and mild cardiac enlargement noted. Mild diffuse pulmonary edema pattern is again noted and appears similar to previous exam. IMPRESSION: No change in mild diffuse pulmonary edema Electronically Signed  By: Kerby Moors M.D.   On: 05/15/2018 08:04   Dg Chest Port 1 View  Result Date:  05/14/2018 CLINICAL DATA:  Acute respiratory failure. EXAM: PORTABLE CHEST 1 VIEW COMPARISON:  05/09/2018 FINDINGS: Endotracheal tube tip is above the carina. There is a left IJ catheter tip projecting over the SVC. Normal heart size. Persistent scar or atelectasis within the left perihilar region. Increase in right upper lobe perihilar densities IMPRESSION: 1. Increase in right upper lobe perihilar densities. Stable left mid lung perihilar atelectasis versus scar. Electronically Signed   By: Kerby Moors M.D.   On: 05/14/2018 08:06   Dg Chest Port 1 View  Result Date: 05/09/2018 CLINICAL DATA:  Respiratory failure, shortness of breath. EXAM: PORTABLE CHEST 1 VIEW COMPARISON:  Portable chest x-ray of May 07, 2018 FINDINGS: The lungs are well-expanded. The interstitial markings have become less conspicuous bilaterally. The right perihilar consolidation persists as does patchy linear density in the left perihilar region. There is no pleural effusion. The heart and pulmonary vascularity are normal. There is calcification in the wall of the aortic arch. The endotracheal tube tip projects 2.6 cm above the carina. The esophagogastric tube tip in proximal port project below the left hemidiaphragm. The left internal jugular venous catheter tip projects at the origin of the SVC. IMPRESSION: Decreased pulmonary interstitial edema. Persistent infiltrate or atelectasis in the perihilar regions with decreasing conspicuity att the right base. The support tubes are in reasonable position. Electronically Signed   By: David  Martinique M.D.   On: 05/09/2018 07:09   Dg Chest Port 1 View  Result Date: 05/07/2018 CLINICAL DATA:  Intubation EXAM: PORTABLE CHEST 1 VIEW COMPARISON:  05/05/2018, 05/04/2018 FINDINGS: Endotracheal tube tip is about 2.3 cm superior to the carina. Esophageal tube tip is below the diaphragm but non included. Left-sided central venous catheter tip overlies the brachiocephalic confluence. Stable to slight  increase interstitial opacity. More confluent areas of airspace disease in the left mid lung and right base. Stable enlarged cardiomediastinal silhouette. Aortic atherosclerosis. No pneumothorax. IMPRESSION: 1. Endotracheal tube tip about 2.3 cm superior to the carina 2. Slight progression of interstitial disease, which may reflect edema on chronic change. 3. Increasing atelectasis or infiltrate at the right base Electronically Signed   By: Donavan Foil M.D.   On: 05/07/2018 18:41   Dg Chest Port 1 View  Result Date: 05/05/2018 CLINICAL DATA:  Respiratory failure EXAM: PORTABLE CHEST 1 VIEW COMPARISON:  05/04/2018 FINDINGS: Endotracheal tube, nasogastric catheter and left jugular central line are noted in satisfactory position. The cardiac shadow is stable. The lungs are well aerated bilaterally. Mild midlung atelectatic changes are noted on the left stable from the prior exam but previously obscured by overlying wires. No sizable effusion is seen. Mild interstitial coarsening is noted. IMPRESSION: Stable appearance of the chest when compared with the previous day. Electronically Signed   By: Inez Catalina M.D.   On: 05/05/2018 07:00   Portable Chest X-ray  Result Date: 05/04/2018 CLINICAL DATA:  Endotracheal tube placement EXAM: PORTABLE CHEST 1 VIEW COMPARISON:  Earlier today FINDINGS: New endotracheal tube with tip 3 cm above the carina. Left IJ line with tip crossing the midline in the region of the distal left brachiocephalic. Hyperinflation and interstitial coarsening. Mild atelectatic type opacities at the bases. There is adenopathy with right perihilar mass by recent CT. IMPRESSION: 1. New endotracheal tube with tip 3 cm above the carina. 2. No change compared to admission chest CT. Electronically Signed   By:  Monte Fantasia M.D.   On: 05/04/2018 07:47   Dg Chest Port 1 View  Result Date: 05/04/2018 CLINICAL DATA:  Respiratory distress. EXAM: PORTABLE CHEST 1 VIEW COMPARISON:  Radiograph May 03, 2018. FINDINGS: Endotracheal and nasogastric tubes have been removed. Left internal jugular catheter is unchanged in position. Stable cardiomediastinal silhouette. No pneumothorax is noted. Stable left midlung opacity is noted concerning for atelectasis or scarring. Stable right hilar mass is noted. Stable interstitial densities are noted in the right perihilar and basilar regions which may represent interstitial spread of carcinoma. Bony thorax is unremarkable. IMPRESSION: Endotracheal and nasogastric tubes have been removed. Otherwise no significant change compared to prior exam. Electronically Signed   By: Marijo Conception, M.D.   On: 05/04/2018 07:18   Dg Chest Port 1 View  Result Date: 05/03/2018 CLINICAL DATA:  Follow-up respiratory failure.  Ventilator support. EXAM: PORTABLE CHEST 1 VIEW COMPARISON:  05/01/2018 FINDINGS: Endotracheal tube tip remains 4 cm above the carina. Nasogastric tube enters the stomach. Left internal jugular central line tip at the SVC azygos level. None right hilar mass with hilar and mediastinal adenopathy in probable interstitial spread of carcinoma. Persistent findings of COPD with additional mild volume loss in the lower lobes. No consolidation or lobar collapse. IMPRESSION: No significant change. Lines and tubes well positioned. Right hilar mass with interstitial spread of cancer, better shown at CT. Mild lower lung volume loss. Electronically Signed   By: Nelson Chimes M.D.   On: 05/03/2018 07:06   Dg Chest Port 1 View  Result Date: 05/01/2018 CLINICAL DATA:  72 year old female status post intubation. EXAM: PORTABLE CHEST 1 VIEW COMPARISON:  Chest radiograph dated 04/16/2018 FINDINGS: An endotracheal tube the tip approximately 4 cm above the carina and an enteric tube with tip in the left upper abdomen likely in the gastric fundus noted. Left IJ central venous line with tip over SVC at the level of the innominate vein. No interval change in the appearance of the lungs since  the prior radiograph. Stable cardiac silhouette. Atherosclerotic calcification of the aorta. IMPRESSION: 1. Endotracheal tube above the carina and enteric tube in the gastric fundus. 2. Emphysema with interstitial prominence and nodularity and small left pleural effusion similar to prior radiograph. Electronically Signed   By: Anner Crete M.D.   On: 05/01/2018 01:51   Dg Chest Port 1 View  Result Date: 04/04/2018 CLINICAL DATA:  Endotracheal tube. EXAM: PORTABLE CHEST 1 VIEW COMPARISON:  Chest CT from earlier today FINDINGS: Endotracheal tube tip between the clavicular heads and carina. Tip projects nearly 3 cm above the carina. Left IJ line with tip near the SVC origin. New orogastric tube reaches the stomach. Large lung volumes with emphysema. Perihilar masslike abnormality with adenopathy by recent chest CT. IMPRESSION: 1. New orogastric tube reaches the stomach. 2. Otherwise stable chest as characterized by CT earlier today. Electronically Signed   By: Monte Fantasia M.D.   On: 04/15/2018 16:55   Dg Abd Portable 1v  Result Date: 05/11/2018 CLINICAL DATA:  OG tube placement. EXAM: PORTABLE ABDOMEN - 1 VIEW COMPARISON:  05/07/2018 FINDINGS: OG tube tip is in the distal body of the stomach. The stomach is not distended. There is contrast throughout the nondistended colon. No dilated small bowel. No appreciable free air. No acute bone abnormality. IMPRESSION: OG tube appears in good position in the distal stomach. Benign-appearing abdomen. Contrast remains in the colon but has moved slightly distally since 05/07/2018. Electronically Signed   By: Lorriane Shire  M.D.   On: 05/11/2018 15:34   Dg Abd Portable 1v  Result Date: 05/07/2018 CLINICAL DATA:  NG tube EXAM: PORTABLE ABDOMEN - 1 VIEW COMPARISON:  05/04/2018 FINDINGS: Esophageal tube tip and side port overlie the gastric body. Radiopaque contrast within the colon. Small pleural effusions. IMPRESSION: Esophageal tube tip and side port overlie the  gastric body. Electronically Signed   By: Donavan Foil M.D.   On: 05/07/2018 18:42   Dg Abd Portable 1v  Result Date: 05/04/2018 CLINICAL DATA:  Check gastric catheter placement EXAM: PORTABLE ABDOMEN - 1 VIEW COMPARISON:  None. FINDINGS: Scattered large and small bowel gas is noted. A gastric catheter is noted coiled within the stomach. IMPRESSION: Gastric catheter coiled within the stomach. Electronically Signed   By: Inez Catalina M.D.   On: 05/04/2018 11:44   Dg Abd Portable 1v  Result Date: 05/04/2018 CLINICAL DATA:  Orogastric tube placement EXAM: PORTABLE ABDOMEN - 1 VIEW COMPARISON:  None. FINDINGS: Orogastric tube with the tip projecting over the stomach. There is no bowel dilatation to suggest obstruction. There is no evidence of pneumoperitoneum, portal venous gas or pneumatosis. There are no pathologic calcifications along the expected course of the ureters. The osseous structures are unremarkable. IMPRESSION: Orogastric tube with the tip projecting over the stomach. Electronically Signed   By: Kathreen Devoid   On: 05/04/2018 11:43   Dg Swallowing Func-speech Pathology  Result Date: 05/06/2018 Objective Swallowing Evaluation: Type of Study: MBS-Modified Barium Swallow Study  Patient Details Name: Rayvn Rickerson MRN: [id number] Date of Birth: June 26, 1946 Today's Date: 05/06/2018 Time: SLP Start Time (ACUTE ONLY): 7619 -SLP Stop Time (ACUTE ONLY): 1445 SLP Time Calculation (min) (ACUTE ONLY): 12 min Past Medical History: Past Medical History: Diagnosis Date . COPD (chronic obstructive pulmonary disease) ([place])  . Hypertension  . Lung cancer [facility])  Past Surgical History:  The histories are not reviewed yet. Please review them in the "History" navigator section and refresh this [place]. HPI: Pt is a 72 yo female smoker travelling from [place] who presented to [facility] with acute on chronic hypoxic/hypercapnic respiratory failure from Pseudomonal pneumonia in setting of COPD with emphysema,  lung cancer, systolic CHF.  ETT 6/27-6/30 (self-extubated), 6/30-7/2, 7/3-7/5. CT chest showed hilar mass with b/l pulmonary artery involvement and possible lymphangitic spread.  Subjective: pt denies difficulty swallowing Assessment / Plan / Recommendation CHL IP CLINICAL IMPRESSIONS 05/06/2018 Clinical Impression Pt presents with oropharyngeal swallow within gross functional limits given age and missing dentition. She has mildly prolonged mastication and intermittent flash penetration, but with no aspiration despite presentation of large, sequential boluses and mixed consistencies. Recommend continuing with regular diet textures and thin liquids, allowing pt to select foods that are soft enough for her to masticate without her dentures as they are reportedly missing. SLP will f/u briefly for tolerance, likely without need for additional f/u post-discharge. SLP Visit Diagnosis Dysphagia, unspecified (R13.10) Attention and concentration deficit following -- Frontal lobe and executive function deficit following -- Impact on safety and function Mild aspiration risk   CHL IP TREATMENT RECOMMENDATION 05/06/2018 Treatment Recommendations Therapy as outlined in treatment plan below   Prognosis 05/06/2018 Prognosis for Safe Diet Advancement Good Barriers to Reach Goals -- Barriers/Prognosis Comment -- CHL IP DIET RECOMMENDATION 05/06/2018 SLP Diet Recommendations Regular solids;Thin liquid Liquid Administration via Cup;Straw Medication Administration Whole meds with liquid Compensations Slow rate;Small sips/bites Postural Changes Seated upright at 90 degrees   CHL IP OTHER RECOMMENDATIONS 05/06/2018 Recommended Consults -- Oral Care Recommendations Oral care BID  Other Recommendations --   CHL IP FOLLOW UP RECOMMENDATIONS 05/06/2018 Follow up Recommendations None   CHL IP FREQUENCY AND DURATION 05/06/2018 Speech Therapy Frequency (ACUTE ONLY) min 1 x/week Treatment Duration 1 week      CHL IP ORAL PHASE 05/06/2018 Oral Phase WFL Oral -  Pudding Teaspoon -- Oral - Pudding Cup -- Oral - Honey Teaspoon -- Oral - Honey Cup -- Oral - Nectar Teaspoon -- Oral - Nectar Cup -- Oral - Nectar Straw -- Oral - Thin Teaspoon -- Oral - Thin Cup -- Oral - Thin Straw -- Oral - Puree -- Oral - Mech Soft -- Oral - Regular -- Oral - Multi-Consistency -- Oral - Pill -- Oral Phase - Comment --  CHL IP PHARYNGEAL PHASE 05/06/2018 Pharyngeal Phase WFL Pharyngeal- Pudding Teaspoon -- Pharyngeal -- Pharyngeal- Pudding Cup -- Pharyngeal -- Pharyngeal- Honey Teaspoon -- Pharyngeal -- Pharyngeal- Honey Cup -- Pharyngeal -- Pharyngeal- Nectar Teaspoon -- Pharyngeal -- Pharyngeal- Nectar Cup -- Pharyngeal -- Pharyngeal- Nectar Straw -- Pharyngeal -- Pharyngeal- Thin Teaspoon -- Pharyngeal -- Pharyngeal- Thin Cup -- Pharyngeal -- Pharyngeal- Thin Straw -- Pharyngeal -- Pharyngeal- Puree -- Pharyngeal -- Pharyngeal- Mechanical Soft -- Pharyngeal -- Pharyngeal- Regular -- Pharyngeal -- Pharyngeal- Multi-consistency -- Pharyngeal -- Pharyngeal- Pill -- Pharyngeal -- Pharyngeal Comment --  CHL IP CERVICAL ESOPHAGEAL PHASE 05/06/2018 Cervical Esophageal Phase WFL Pudding Teaspoon -- Pudding Cup -- Honey Teaspoon -- Honey Cup -- Nectar Teaspoon -- Nectar Cup -- Nectar Straw -- Thin Teaspoon -- Thin Cup -- Thin Straw -- Puree -- Mechanical Soft -- Regular -- Multi-consistency -- Pill -- Cervical Esophageal Comment -- No flowsheet data found. Germain Osgood 05/06/2018, 3:45 PM  Germain Osgood, M.A. CCC-SLP [phone]               Procedures/Operations  ETT 6/27 >> 6/30 Lt IJ CVL 6/27 >> Rt radial aline 6/27 >> 7/01 ETT 6/30 >> 7/02 ETT 7/03 >> 7/05 ETT 7/06 >> 7/15 Bronchoscopy 7/8 >> small cell lung cancer.     Rose Fillers Soniya Ashraf 05/25/2018, 11:26 AM

## 2018-06-02 NOTE — Progress Notes (Signed)
Checked in with family- Daughter said she didn't need anything and she was not religious.   Conard Novak, Chaplain   05/18/2018 1000  Clinical Encounter Type  Visited With Patient and family together  Visit Type Follow-up;Spiritual support  Referral From Physician  Consult/Referral To Chaplain  Spiritual Encounters  Spiritual Needs Emotional  Stress Factors  Patient Stress Factors Other (Comment) (End of life)  Family Stress Factors Other (Comment) (End of Life)

## 2018-06-02 NOTE — Progress Notes (Signed)
PCCM Interval Note  Patient Family at bedside. Husband and Daughter ready to transition to comfort care. Orders placed in EMR.   Dawn Howe, AGACNP-BC [facility]  Pgr: [phone]  PCCM Pgr: [phone]

## 2018-06-02 NOTE — Progress Notes (Signed)
PCCM Interval Note   Patient with continued hypotension. AM Labs being sent now. Family at bedside and updated on condition. Will continue medical management however not escalate care to include pressors.   Hayden Pedro, AGACNP-BC [facility]  Pgr: [phone]  PCCM Pgr: [phone]

## 2018-06-02 NOTE — Progress Notes (Signed)
Family decided to transition to comfort care. Pt started on morphine drip and extubated to [place]. No signs of distress. Family at bedside. Emotional support given to patient and family.

## 2018-06-02 NOTE — Progress Notes (Signed)
PULMONARY / CRITICAL CARE MEDICINE   Name: Dawn Howe MRN: [id number] DOB: 06-24-1946    ADMISSION DATE:  04/03/2018  REFERRING MD:  [facility].  CHIEF COMPLAINT:  Short of breath  HISTORY OF PRESENT ILLNESS:   72 yo female smoker travelling from [place] presented to [facility] hospital with dyspnea, and near syncope.  Required intubation for hypoxia/hypercapnia.  CT chest showed hilar mass with b/l pulmonary artery involvement and possible lymphangitic spread.  Echo showed systolic dysfunction.  Found to have Pseudomonal pneumonia and COPD exacerbation.  Has failed several attempts at extubation.   SUBJECTIVE:  Patient progressively unstable overnight, family called in.  Was planning for one way extubation today however due to progression, was transitioned to comfort care/ extubated this am on morphine gtt.  Currently maxed on morphine gtt at 20 mg/hr  VITAL SIGNS: BP (!) 79/46   Pulse (!) 115   Temp 97.8 F (36.6 C) (Oral)   Resp (!) 4   Ht 5\' 4"  (1.626 m)   Wt 168 lb 3.4 oz (76.3 kg)   SpO2 (!) 78%   BMI 28.87 kg/m   INTAKE / OUTPUT: I/O last 3 completed shifts: In: 2188.7 [I.V.:838.7; NG/GT:1350] Out: 1010 [Urine:1010]  PHYSICAL EXAMINATION: General:  Ill appearing female in mild distress Neuro: unresponsive  CV: weak pulses PULM: agonal  Extremities: cool/ ashen    LABS:  BMET Recent Labs  Lab 05/14/18 0440 05/15/18 0419 2018/05/24 0027  NA 140 142 142  K 3.5 3.9 4.1  CL 104 107 109  CO2 30 30 29   BUN 20 19 20   CREATININE 0.54 0.54 0.57  GLUCOSE 148* 139* 126*   Electrolytes Recent Labs  Lab 05/09/18 1835  05/14/18 0440 05/15/18 0419 2018/05/24 0027  CALCIUM  --    < > 8.1* 8.3* 8.2*  MG 2.3  --   --   --  2.1  PHOS 3.4  --   --   --   --    < > = values in this interval not displayed.   CBC Recent Labs  Lab 05/14/18 0440 05/15/18 0419 2018/05/24 0027  WBC 10.2 9.6 10.7*  HGB 9.0* 8.7* 8.3*  HCT 29.4* 28.8* 27.9*  PLT 185 174 171    ABG No results for input(s): PHART, PCO2ART, PO2ART in the last 168 hours. Glucose Recent Labs  Lab 05/15/18 0358 05/15/18 0757 05/15/18 1204 05/15/18 1602 05/15/18 1943 05/15/18 2338  GLUCAP 153* 109* 162* 89 125* 92   Imaging No results found.   STUDIES:  CT angio chest 6/27 [facility]) >> tumor encasement or b/l PA, b/l hilar masses, centrilobular emphysema, septal thickening, scattered nodules Echo 6/30 >> EF 45 to 50%, grade 1 DD, mod LA dilation, mild MR, PAS 36 mmHg  CULTURES: Urine 6/27 >> negative Sputum 6/27 >> Pseudomonas aeruginosa Blood 6/27 >> negative  ANTIBIOTICS: Zithromax 6/27 >> 6/28 Rocephin 6/27 >> 6/28 Fortaz 6/29 >> 7/05  SIGNIFICANT EVENTS: 6/27 transfer from [place], cardiology consulted 6/30 self extubated >> stridor >> reintubated, start decadron 7/02 extubated 7/03 wheeze, hypoxia, AMS >> reintubated 7/05 extubate 7/06 reintubated 7/08 bronchoscopy reveals external compression of subsegmental airway RUL. Biopsied.  LINES/TUBES: ETT 6/27 >> 6/30 (self extubation) Lt IJ CVL 6/27 >> Rt radial aline 6/27 >> 7/01 ETT 6/30 >> 7/02 ETT 7/03 >> 7/05 ETT 7/06 >>   DISCUSSION: 72 yo female smoker with acute on chronic hypoxic/hypercapnic respiratory failure from Pseudomonal pneumonia in setting of COPD with emphysema, lung cancer, systolic CHF.  Self  extubated 6/30 with post extubation stridor and reintubated.  Extubated on 7/02.  Developed hypoxia/hypercapnia, wheezing, AMS 7/03 and reintubated.  Improved 7/05 >> will try extubation again.  Reintubated on 7/6.  Underwent bronchoscopy 7/8 biopsies obtained demonstrated small cell lung cancer.  Was transitioned to comfort care this am due to progressive hypotension and bradycardia overnight.    ASSESSMENT / PLAN:  Acute on chronic respiratory failure with hypoxia, hypercapnia in setting of pseudomonas PNA and AECOPD (underlying severe emphysema)  Post-extubation stridor OSA  Hx of  lung cancer now with diagnosis of small cell lung cancer obtained by bronchoscopy on 7/8 Pseudomonas Pneumonia Acute metabolic encephalopathy Acute systolic and diastolic CHF NSTEMI Type II. Anemia of critical illness DM type II with steroid induced hyperglycemia.  Plan:  DNR Continue morphine gtt- currently at max, with prn dosing D/c fentanyl prn Add ativan 1-2 mg / hr prn  Add dilaudid 1-2 mg/ hr prn  Prn robinul  Ongoing supportive care for family  Appreciate palliative assistance   Global:  Hold in ICU for now as I suspect patient will pass shortly being unresponsive, agonal and having tachy/ bradycardic episodes.  Daughter and husband at bedside with ongoing support.     Kennieth Rad, AGACNP-BC [facility] Pgr: [phone] or if no answer [phone] 06/06/2018, 10:34 AM

## 2018-06-02 NOTE — Progress Notes (Signed)
Nutrition Brief Note  Chart reviewed. Pt now transitioning to comfort care.  No further nutrition interventions warranted at this time.  Please re-consult as needed.   Kerman Passey MS, RD, [place], [place] [phone] Pager  [phone] Weekend/On-Call Pager

## 2018-06-02 NOTE — Progress Notes (Signed)
This RN & Dillion White, RN auscultated pt lung & heart sounds to confirm pts heart had stopped. Jerene Pitch, CCM NP made aware  Asytole strip printed & placed in chart  Time of death 10:37am

## 2018-06-02 NOTE — Procedures (Signed)
Extubation Procedure Note  Patient Details:   Name: Dawn Howe DOB: October 12, 1946 MRN: [id number]   Airway Documentation:  Airway 7.5 mm (Active)  Secured at (cm) 24 cm 06-11-18  3:01 AM  Measured From Lips Jun 11, 2018  3:01 AM  Secured Location Left 06-11-18  3:01 AM  Secured By [employer] 06-11-2018  3:01 AM  Tube Holder Repositioned Yes 06/11/2018  3:01 AM  Cuff Pressure (cm H2O) 24 cm H2O 05/15/2018  7:37 AM  Site Condition Dry June 11, 2018  3:01 AM   Vent end date: 06/11/2018 Vent end time: 3664   Evaluation  O2 sats: stable throughout Complications: No apparent complications Patient did tolerate procedure well. Bilateral Breath Sounds: Diminished, Expiratory wheezes   No, patient was extubated (one-way) per MD orders and family wishes.    Holli Rengel A Marieann Zipp 06/11/2018, 4:49 AM

## 2018-06-02 NOTE — Progress Notes (Addendum)
Late Entry 05/1518 2035: Pt was given 147mcg of Fentanyl 05/15/18 0651 instead of 25 mcg IV fentanyl, due to new order from Dr. Oletta Darter written at [phone].

## 2018-06-02 DEATH — deceased

## 2018-12-01 IMAGING — DX DG ABD PORTABLE 1V
1 series · 1 of 1 positions shown · non-contrast
Comparison: 05/04/2018

CLINICAL DATA: NG tube

EXAM:
PORTABLE ABDOMEN - 1 VIEW

[abdomen]
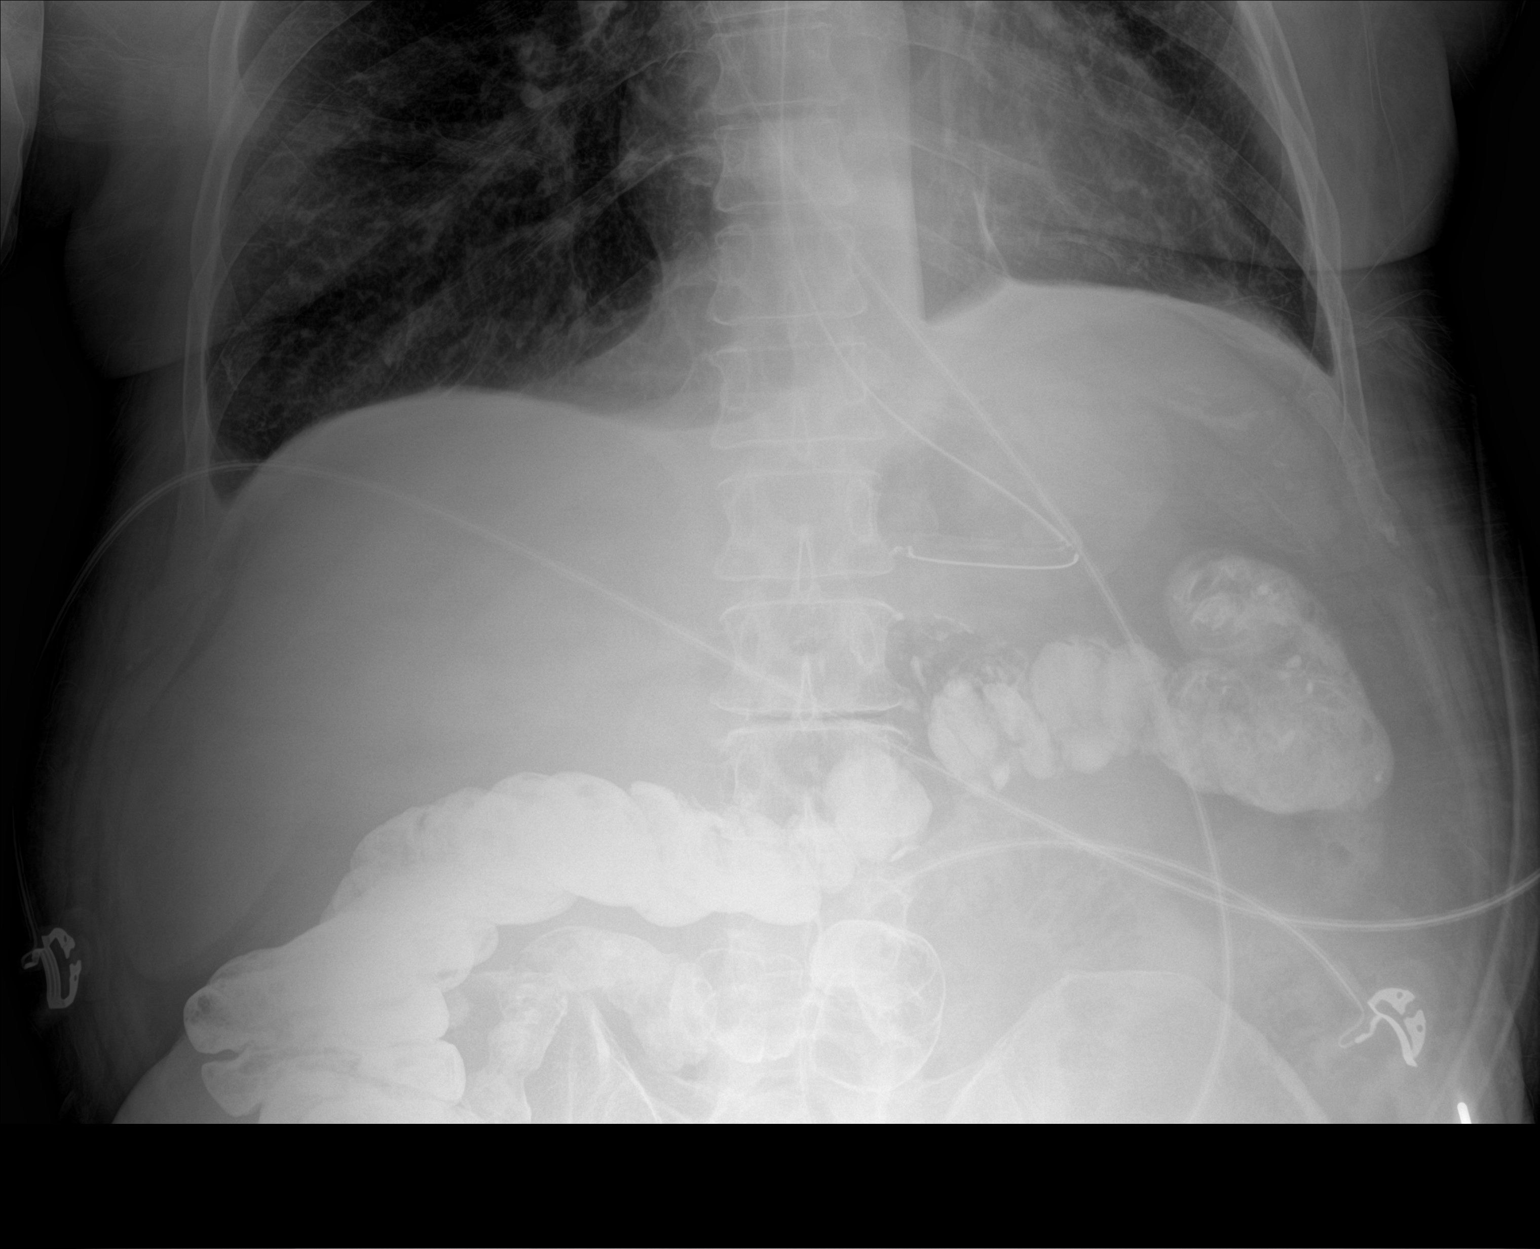

[1 of 1 positions shown; findings below may reference images not displayed]

FINDINGS: Esophageal tube tip and side port overlie the gastric body.
Radiopaque contrast within the colon. Small pleural effusions.
IMPRESSION: Esophageal tube tip and side port overlie the gastric body.

## 2018-12-03 IMAGING — DX DG CHEST 1V PORT
1 series · 1 of 1 positions shown · non-contrast
Comparison: Portable chest x-ray May 07, 2018

CLINICAL DATA: Respiratory failure, shortness of breath.

EXAM:
PORTABLE CHEST 1 VIEW

[chest ap]
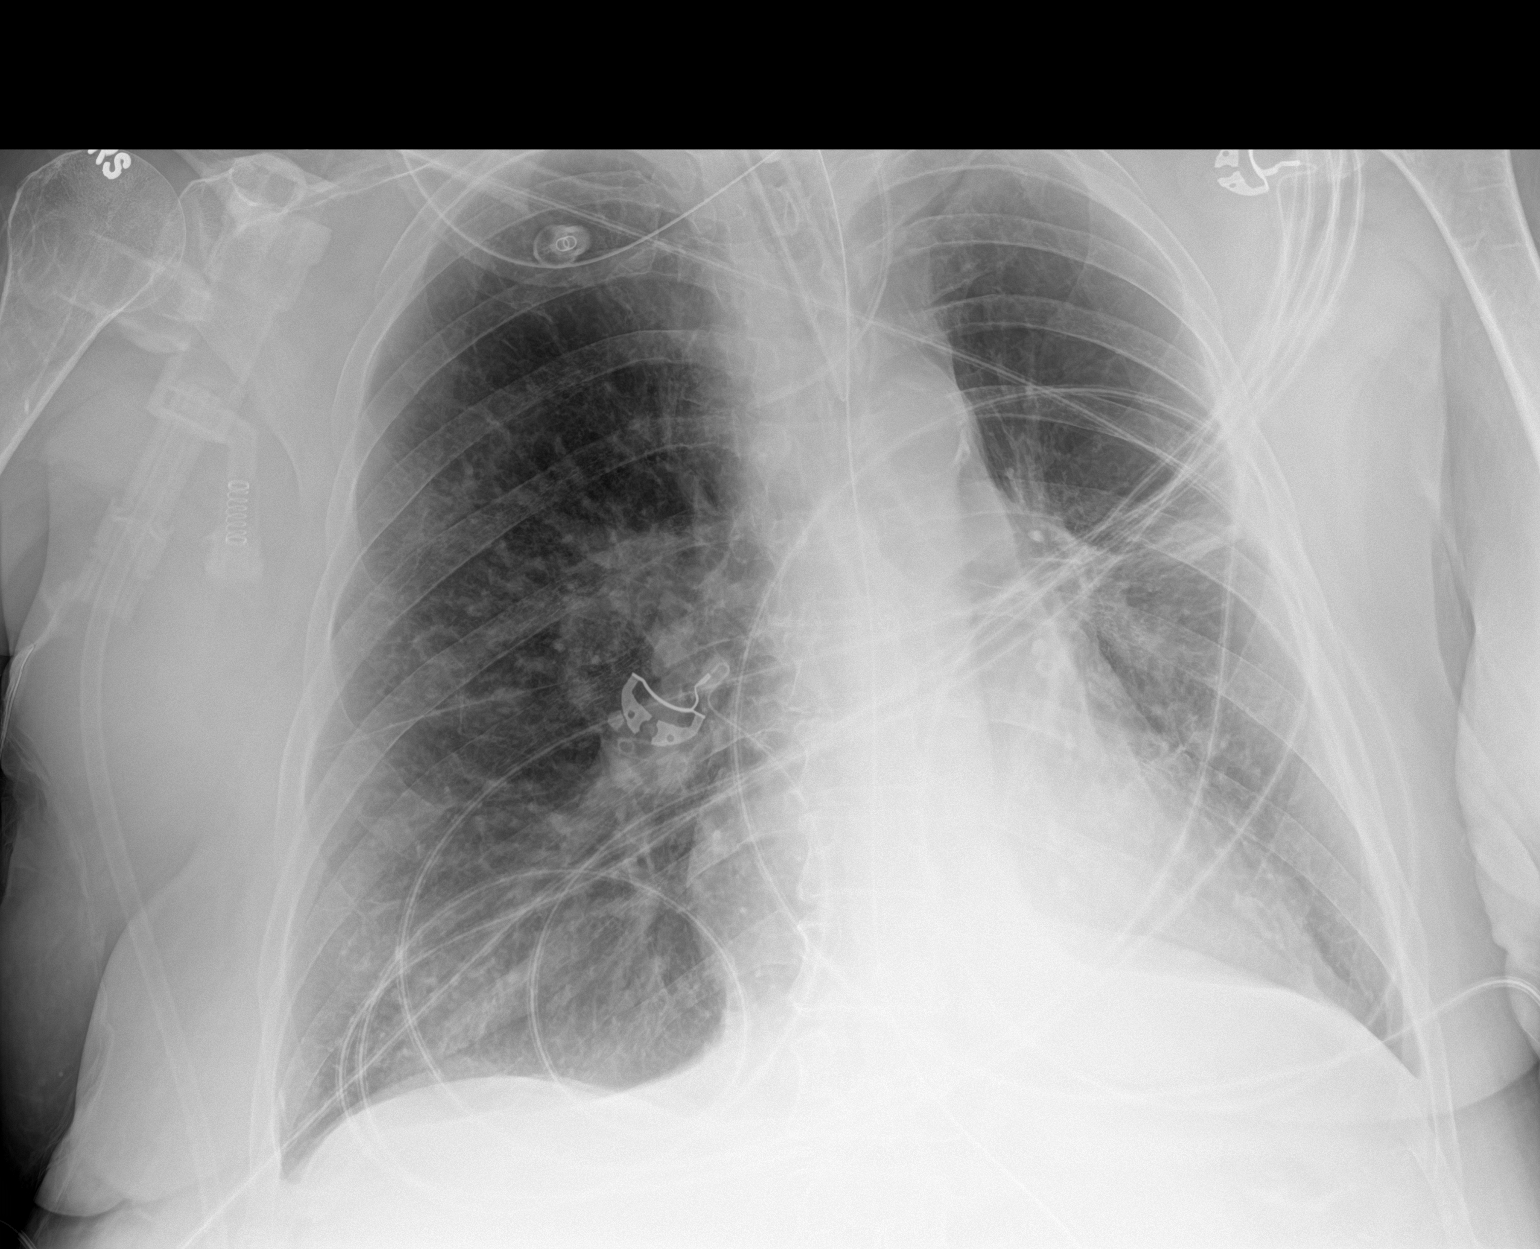

[1 of 1 positions shown; findings below may reference images not displayed]

FINDINGS: The lungs are well-expanded. The interstitial markings have become
less conspicuous bilaterally. The right perihilar consolidation
persists as does patchy linear density in the left perihilar region.
There is no pleural effusion. The heart and pulmonary vascularity
are normal. There is calcification in the wall of the aortic arch.
The endotracheal tube tip projects 2.6 cm above the carina. The
esophagogastric tube tip in proximal port project below the left
hemidiaphragm. The left internal jugular venous catheter tip
projects at the origin of the SVC.
IMPRESSION: Decreased pulmonary interstitial edema. Persistent infiltrate or
atelectasis in the perihilar regions with decreasing conspicuity att
the right base.. The support tubes are in reasonable position.

## 2018-12-05 IMAGING — DX DG ABD PORTABLE 1V
1 series · 1 of 1 positions shown · non-contrast
Comparison: 05/07/2018

CLINICAL DATA: OG tube placement.

EXAM:
PORTABLE ABDOMEN - 1 VIEW

[abdomen kub]
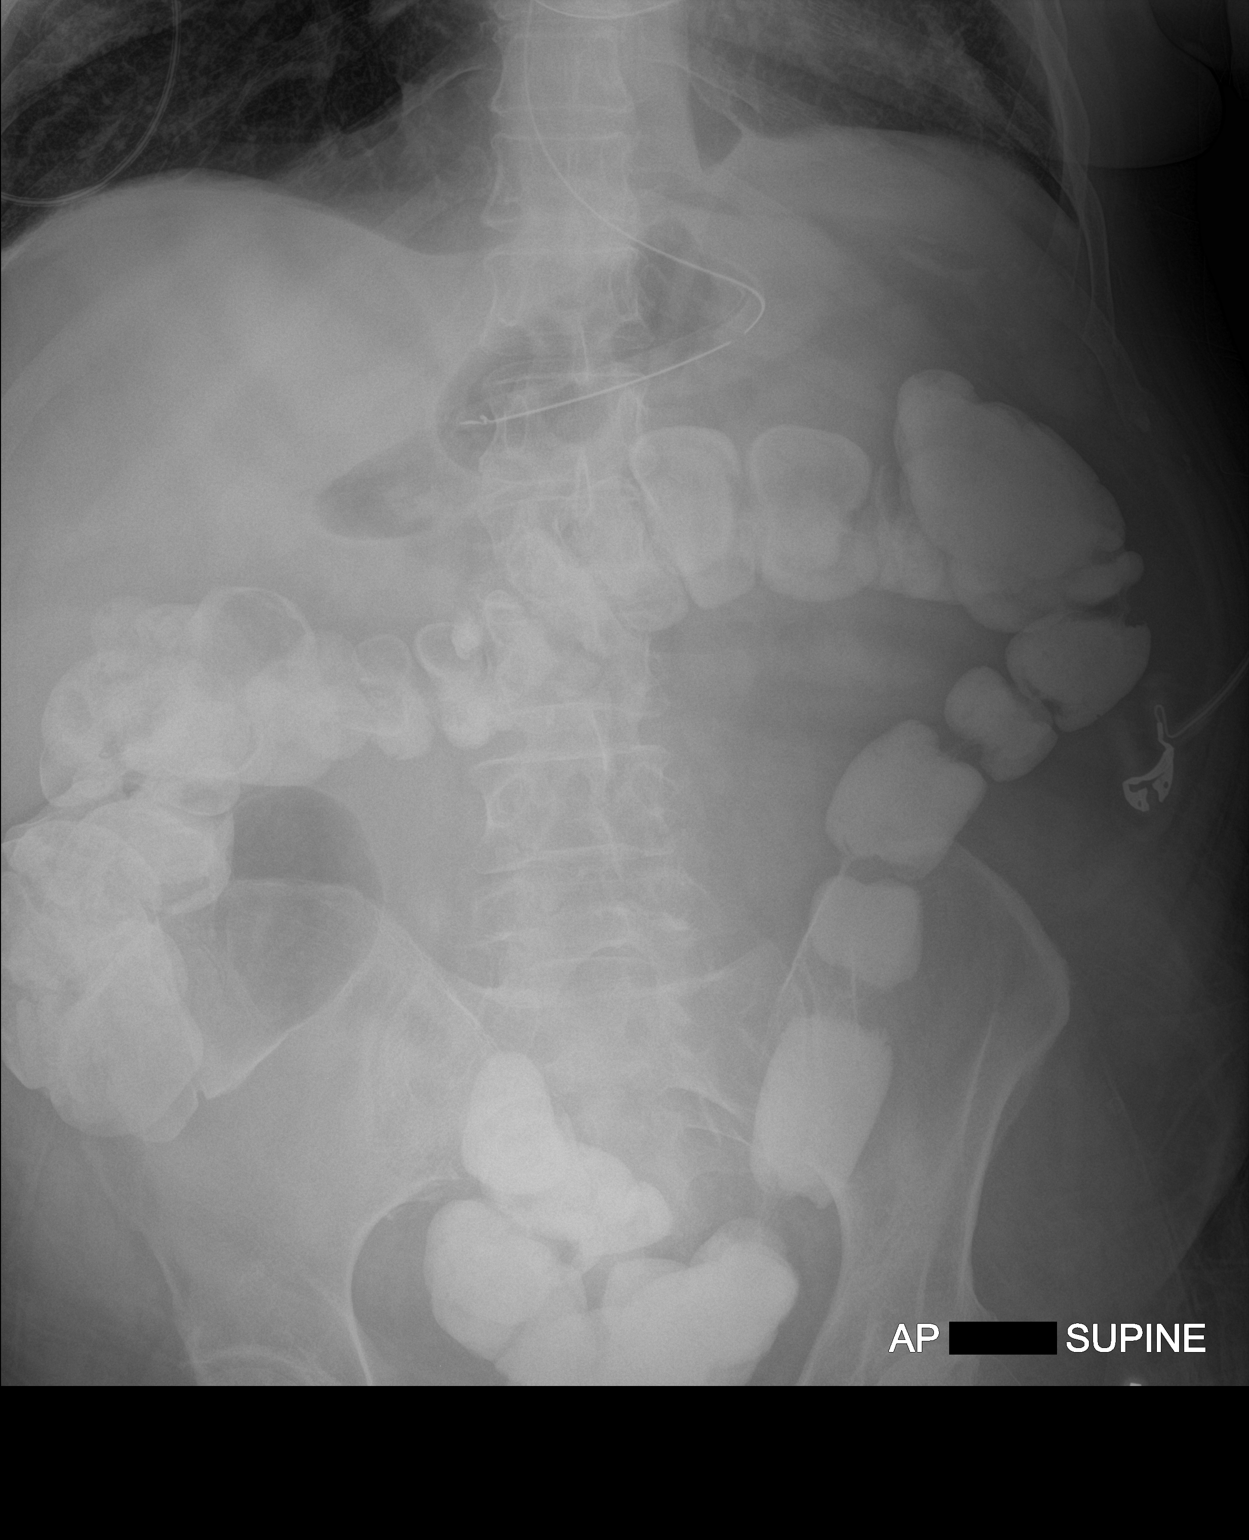

[1 of 1 positions shown; findings below may reference images not displayed]

FINDINGS: OG tube tip is in the distal body of the stomach. The stomach is not
distended. There is contrast throughout the nondistended colon. No
dilated small bowel. No appreciable free air. No acute bone
abnormality.
IMPRESSION: OG tube appears in good position in the distal stomach.
Benign-appearing abdomen. Contrast remains in the colon but has
moved slightly distally since 05/07/2018.
